# Patient Record
Sex: Male | Born: 1975 | Race: Black or African American | Hispanic: No | Marital: Single | Smoking: Never smoker
Health system: Southern US, Community
[De-identification: ages and names within clinical notes are randomized; demographics above are authoritative.]

## PROBLEM LIST (undated history)

## (undated) DIAGNOSIS — R0683 Snoring: Secondary | ICD-10-CM

## (undated) DIAGNOSIS — K259 Gastric ulcer, unspecified as acute or chronic, without hemorrhage or perforation: Secondary | ICD-10-CM

## (undated) DIAGNOSIS — E119 Type 2 diabetes mellitus without complications: Secondary | ICD-10-CM

## (undated) DIAGNOSIS — E785 Hyperlipidemia, unspecified: Secondary | ICD-10-CM

## (undated) DIAGNOSIS — F329 Major depressive disorder, single episode, unspecified: Secondary | ICD-10-CM

## (undated) DIAGNOSIS — F32A Depression, unspecified: Secondary | ICD-10-CM

## (undated) DIAGNOSIS — I10 Essential (primary) hypertension: Secondary | ICD-10-CM

## (undated) HISTORY — DX: Essential (primary) hypertension: I10

## (undated) HISTORY — DX: Snoring: R06.83

## (undated) HISTORY — DX: Hyperlipidemia, unspecified: E78.5

## (undated) HISTORY — DX: Morbid (severe) obesity due to excess calories: E66.01

## (undated) HISTORY — DX: Gastric ulcer, unspecified as acute or chronic, without hemorrhage or perforation: K25.9

## (undated) HISTORY — DX: Depression, unspecified: F32.A

## (undated) HISTORY — PX: ORCHIOPEXY: SHX479

## (undated) HISTORY — DX: Major depressive disorder, single episode, unspecified: F32.9

## (undated) HISTORY — DX: Type 2 diabetes mellitus without complications: E11.9

---

## 2006-06-01 ENCOUNTER — Emergency Department (HOSPITAL_COMMUNITY): Admission: EM | Admit: 2006-06-01 | Discharge: 2006-06-01 | Payer: Self-pay | Admitting: Emergency Medicine

## 2007-02-11 ENCOUNTER — Emergency Department (HOSPITAL_COMMUNITY): Admission: EM | Admit: 2007-02-11 | Discharge: 2007-02-12 | Payer: Self-pay | Admitting: Emergency Medicine

## 2007-02-12 ENCOUNTER — Encounter: Admission: RE | Admit: 2007-02-12 | Discharge: 2007-02-12 | Payer: Self-pay | Admitting: Internal Medicine

## 2009-03-03 ENCOUNTER — Emergency Department (HOSPITAL_COMMUNITY): Admission: EM | Admit: 2009-03-03 | Discharge: 2009-03-04 | Payer: Self-pay | Admitting: Emergency Medicine

## 2009-03-05 ENCOUNTER — Emergency Department (HOSPITAL_COMMUNITY): Admission: EM | Admit: 2009-03-05 | Discharge: 2009-03-06 | Payer: Self-pay | Admitting: Emergency Medicine

## 2009-03-27 ENCOUNTER — Ambulatory Visit: Payer: Self-pay | Admitting: Professional

## 2009-04-03 ENCOUNTER — Ambulatory Visit: Payer: Self-pay | Admitting: Professional

## 2009-04-10 ENCOUNTER — Ambulatory Visit: Payer: Self-pay | Admitting: Professional

## 2009-04-24 ENCOUNTER — Ambulatory Visit: Payer: Self-pay | Admitting: Professional

## 2009-05-01 ENCOUNTER — Ambulatory Visit: Payer: Self-pay | Admitting: Professional

## 2009-05-08 ENCOUNTER — Ambulatory Visit: Payer: Self-pay | Admitting: Professional

## 2009-05-15 ENCOUNTER — Ambulatory Visit: Payer: Self-pay | Admitting: Professional

## 2009-05-22 ENCOUNTER — Ambulatory Visit: Payer: Self-pay | Admitting: Professional

## 2009-06-05 ENCOUNTER — Ambulatory Visit: Payer: Self-pay | Admitting: Professional

## 2009-06-26 ENCOUNTER — Ambulatory Visit: Payer: Self-pay | Admitting: Professional

## 2009-07-10 ENCOUNTER — Ambulatory Visit: Payer: Self-pay | Admitting: Professional

## 2009-07-24 ENCOUNTER — Ambulatory Visit: Payer: Self-pay | Admitting: Professional

## 2009-08-21 ENCOUNTER — Ambulatory Visit: Payer: Self-pay | Admitting: Professional

## 2009-09-28 ENCOUNTER — Ambulatory Visit: Payer: Self-pay | Admitting: Professional

## 2010-04-23 LAB — DIFFERENTIAL
Basophils Absolute: 0 10*3/uL (ref 0.0–0.1)
Basophils Relative: 0 % (ref 0–1)
Eosinophils Relative: 0 % (ref 0–5)
Lymphocytes Relative: 32 % (ref 12–46)
Monocytes Absolute: 0.7 10*3/uL (ref 0.1–1.0)

## 2010-04-23 LAB — GLUCOSE, CAPILLARY: Glucose-Capillary: 117 mg/dL — ABNORMAL HIGH (ref 70–99)

## 2010-04-23 LAB — CBC
HCT: 42 % (ref 39.0–52.0)
MCHC: 33.9 g/dL (ref 30.0–36.0)
Platelets: 183 10*3/uL (ref 150–400)
RDW: 12.7 % (ref 11.5–15.5)

## 2010-04-23 LAB — COMPREHENSIVE METABOLIC PANEL
ALT: 16 U/L (ref 0–53)
AST: 19 U/L (ref 0–37)
Alkaline Phosphatase: 76 U/L (ref 39–117)
CO2: 22 mEq/L (ref 19–32)
Calcium: 9.3 mg/dL (ref 8.4–10.5)
GFR calc Af Amer: >60 mL/min (ref >60)
GFR calc non Af Amer: >60 mL/min (ref >60)
Glucose, Bld: 117 mg/dL — ABNORMAL HIGH (ref 70–99)
Potassium: 3.3 mEq/L — ABNORMAL LOW (ref 3.5–5.1)
Sodium: 137 mEq/L (ref 135–145)

## 2010-04-23 LAB — URINALYSIS, ROUTINE W REFLEX MICROSCOPIC
Glucose, UA: NEGATIVE mg/dL
Hgb urine dipstick: NEGATIVE
Protein, ur: NEGATIVE mg/dL

## 2010-04-23 LAB — RAPID URINE DRUG SCREEN, HOSP PERFORMED
Cocaine: NOT DETECTED
Opiates: NOT DETECTED

## 2010-10-25 LAB — COMPREHENSIVE METABOLIC PANEL
ALT: 26
AST: 19
Alkaline Phosphatase: 73
CO2: 25
Chloride: 99
GFR calc Af Amer: >60
GFR calc non Af Amer: >60
Sodium: 132 — ABNORMAL LOW
Total Bilirubin: 0.7

## 2010-10-25 LAB — CBC
HCT: 42.2
Hemoglobin: 14.1
MCHC: 33.5
Platelets: 158
RDW: 13.1

## 2010-10-25 LAB — DIFFERENTIAL
Basophils Absolute: 0
Basophils Relative: 1
Eosinophils Absolute: 0.1
Monocytes Absolute: 0.6
Neutro Abs: 3.9

## 2010-10-25 LAB — D-DIMER, QUANTITATIVE: D-Dimer, Quant: 0.34

## 2012-07-17 ENCOUNTER — Other Ambulatory Visit (HOSPITAL_COMMUNITY): Payer: Self-pay | Admitting: Internal Medicine

## 2012-07-17 ENCOUNTER — Ambulatory Visit (HOSPITAL_COMMUNITY)
Admission: RE | Admit: 2012-07-17 | Discharge: 2012-07-17 | Disposition: A | Discharge status: Home / Self Care | Payer: BC Managed Care – PPO | Source: Ambulatory Visit | Attending: Cardiovascular Disease | Admitting: Cardiovascular Disease

## 2012-07-17 DIAGNOSIS — R9431 Abnormal electrocardiogram [ECG] [EKG]: Secondary | ICD-10-CM

## 2012-07-17 DIAGNOSIS — E785 Hyperlipidemia, unspecified: Secondary | ICD-10-CM | POA: Insufficient documentation

## 2012-07-17 DIAGNOSIS — R079 Chest pain, unspecified: Secondary | ICD-10-CM

## 2012-07-17 DIAGNOSIS — I10 Essential (primary) hypertension: Secondary | ICD-10-CM

## 2012-07-17 NOTE — Progress Notes (Signed)
[facility]   2D echo completed 07/17/2012.   Cindy Idil Maslanka, RDCS  

## 2013-04-15 ENCOUNTER — Encounter: Payer: Self-pay | Admitting: Neurology

## 2013-05-07 ENCOUNTER — Encounter: Payer: Self-pay | Admitting: Neurology

## 2013-05-07 ENCOUNTER — Encounter (INDEPENDENT_AMBULATORY_CARE_PROVIDER_SITE_OTHER): Payer: Self-pay

## 2013-05-07 ENCOUNTER — Ambulatory Visit (INDEPENDENT_AMBULATORY_CARE_PROVIDER_SITE_OTHER): Payer: BC Managed Care – PPO | Admitting: Neurology

## 2013-05-07 VITALS — BP 132/89 | HR 97 | Resp 16 | Ht 72.0 in | Wt 289.0 lb

## 2013-05-07 DIAGNOSIS — R51 Headache: Secondary | ICD-10-CM

## 2013-05-07 DIAGNOSIS — R0989 Other specified symptoms and signs involving the circulatory and respiratory systems: Secondary | ICD-10-CM

## 2013-05-07 DIAGNOSIS — G478 Other sleep disorders: Secondary | ICD-10-CM

## 2013-05-07 DIAGNOSIS — R519 Headache, unspecified: Secondary | ICD-10-CM

## 2013-05-07 DIAGNOSIS — R0609 Other forms of dyspnea: Secondary | ICD-10-CM

## 2013-05-07 DIAGNOSIS — R0683 Snoring: Secondary | ICD-10-CM

## 2013-05-07 HISTORY — DX: Snoring: R06.83

## 2013-05-07 HISTORY — DX: Morbid (severe) obesity due to excess calories: E66.01

## 2013-05-07 NOTE — Progress Notes (Signed)
[facility]  Provider:  Melvyn Novas, M D  Referring Provider: Hoyle Sauer, MD Primary Care Physician:  Hoyle Sauer, MD  Chief Complaint  Patient presents with  . New Evaluation    Room 10  . Sleep Apnea    HPI:  Clinton Jackson is a 38 y.o. african -american, right handed  male  Is seen here as a referral  from Dr. Felipa Eth for a sleep evaluation.    As the [place] reports that he has been heavier than the average person for much of his adult life, and that there was normal age her weight gain over the last 2 years perhaps 10 pounds up and down. As a young man he developed depression and has been in treatment. He continues to see a Veterinary surgeon. He states that he is now in his first year at [employer] . When visiting his parents, his father stated that he knew his son was back in town- because  "the [place] were Shaking". He refers to the  patient's thunderously loud snoring.  Apparently this has been a factor for a decade or longer. The patient states that he has trouble falling asleep, staying asleep and wakes up un-refreshed. He always wished to sleep "another hour or two" Mr.Gettis goes to bed about midnight , needs an hour to fall asleep, he watches TV and feels this helps him to distract himself from daytime stress and worries. He wakes up 2 hors into his sleep time and than  goes to the bathroom , a total of 2 times at night. He may again 30 minutes to fall asleep again, and he watches TV. He rises at 9 in AM mostly spontaneously, has an alarm " to make sure" . He reports vivid dreams,"crazy nightmares ". He will wake up out of dream sleep from snoring, gasping for air.  He has a breakfast and goes to classes online , full time. He is not on campus.  He doesn't exercise . He has trouble to self pace through his day. He lives and sleeps alone.  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        He has had neither neck injuries nor  surgery, nor jaw or ENT surgery.  He is single, denies any use of tobacco or alcohol, but  drinks tea and Sodas.  His parents are both CPAP users, with OSA.  He has 4 sisters and 2 brothers, many of them snore but none has been diagnosed   The patient underwent a sleep study at [facility] in 2011, at the time with an Epworth sleepiness score of 9 points, Beck Depression Inventory and Dorst at 9 points, and on Klonopin, but will be on, fluoxetine and amlodipine. The patient had an AHI of 3.3 and nor periodic limb movements. His heart rate was in normal sinus rhythm. The study found no significant sleep apnea or oxygen desaturations. Snoring was noted.   Review of Systems: Out of a complete 14 system review, the patient complains of only the following symptoms, and all other reviewed systems are negative.  Today's Epworth sleepiness score of is 5 points, his fatigue severity score is 50 points and he feels his depression is well controlled. He states that he feels he at this time sleepy even when he gets enough sleep apparently about 8 hours of sleep nightly on average he reports also some headaches  and recent weight gain.   History   Social History  . Marital Status: Single    Spouse Name: N/A    Number of Children: N/A  . Years of Education: Masters-2   Occupational History  . Teacher     [employer]  .     Social History Main Topics  . Smoking status: Never Smoker   . Smokeless tobacco: Never Used  . Alcohol Use: No  . Drug Use: No  . Sexual Activity: Not on file   Other Topics Concern  . Not on file   Social History Narrative   Patient is single.   Patient drinks three cups of tea daily and one soda daily.   Patient is a Physicist, medical.   Patient has a Scientist, water quality (2).   Patient is right-handed.    Family History  Problem Relation Age of Onset  . Hypertension Father   . Coronary artery disease Father   . Ulcers  Father   . Hyperlipidemia Father   . Sleep apnea Father   . Diabetes Mellitus II Mother   . Arthritis/Rheumatoid Mother   . Hypertension Mother   . Sleep apnea Mother     Past Medical History  Diagnosis Date  . Hypertension   . Multiple gastric ulcers     Past Surgical History  Procedure Laterality Date  . Orchiopexy      Current Outpatient Prescriptions  Medication Sig Dispense Refill  . amLODipine (NORVASC) 10 MG tablet 1 tablet daily.      Marland Kitchen aspirin 81 MG tablet Take 81 mg by mouth daily.      Marland Kitchen buPROPion (WELLBUTRIN SR) 200 MG 12 hr tablet 1 tablet daily.      Marland Kitchen FLUoxetine (PROZAC) 10 MG capsule Take 10 mg by mouth daily.      Marland Kitchen FLUoxetine (PROZAC) 40 MG capsule Take 40 mg by mouth daily.      . fluticasone (FLONASE) 50 MCG/ACT nasal spray 2 sprays daily.      . hydrochlorothiazide (HYDRODIURIL) 25 MG tablet 1 tablet daily.       No current facility-administered medications for this visit.    Allergies as of 05/07/2013 - Review Complete 05/07/2013  Allergen Reaction Noted  . Penicillins Rash 05/07/2013    Vitals: BP 132/89  Pulse 97  Resp 16  Ht 6' (1.829 m)  Wt 289 lb (131.09 kg)  BMI 39.19 kg/m2 Last Weight:  Wt Readings from Last 1 Encounters:  05/07/13 289 lb (131.09 kg)       Physical exam:  General: The patient is awake, alert and appears not in acute distress. The patient is well groomed. Head: Normocephalic, atraumatic. Neck is supple. Mallampati  2 with an arched, peaked palate and lateral narrowing, , neck circumference:20  Cardiovascular:  Regular rate and rhythm- 100 bpm ,  without  murmurs or carotid bruit, and without distended neck veins. Respiratory: Lungs are clear to auscultation. Skin:  Without evidence of edema, or rash Trunk: BMI is elevated and patient  has normal posture.  Neurologic exam : The patient is awake and alert, oriented to place and time.  Memory subjective  described as intact. There is a normal attention span &  concentration ability. Speech is fluent without dysarthria, dysphonia or aphasia. Mood and affect are appropriate.  Cranial nerves: Pupils are equal and briskly reactive to light. Funduscopic exam without  evidence of pallor or edema. Extraocular movements  in vertical and horizontal planes intact and without nystagmus. Visual  fields by finger perimetry are intact. Hearing to finger rub intact.  Facial sensation intact to fine touch. Facial motor strength is symmetric and tongue and uvula move midline.  Motor exam:  Normal tone , muscle bulk and symmetric normal strength in all extremities.  Sensory:  Fine touch, pinprick and vibration were tested in all extremities. Proprioception is tested  normal.  Coordination: Rapid alternating movements in the fingers/hands is tested and normal. Finger-to-nose maneuver tested and normal without evidence of ataxia, dysmetria or tremor.  Gait and station: Patient walks without assistive device . Deep tendon reflexes: in the  upper and lower extremities are symmetric and intact. Babinski maneuver downgoing.   Assessment:  After physical and neurologic examination, review of laboratory studies, imaging, neurophysiology testing and pre-existing records, assessment is  Thunderous snoring and habitual mouth breathing , secondary to obesity , narrow Mallampati and  nasal congestion, OSA  Is likely.  Insomnia, nocturia  may be due to depression and OSA.  morning headaches, CO2 retainer? Obesity hypoventilation is a differential possibility.   Last study revealed  UARS - may be a candidate for a dental device, he has bruxism.   Plan:  Treatment plan and additional workup : SPLIT at 15 and 3% score, CO2 needed.  Get patient as later sleep patient , and allow benadryl po.  Discussed [place] for insomnia .

## 2013-05-07 NOTE — Patient Instructions (Signed)
Insomnia Insomnia is frequent trouble falling and/or staying asleep. Insomnia can be a long term problem or a short term problem. Both are common. Insomnia can be a short term problem when the wakefulness is related to a certain stress or worry. Long term insomnia is often related to ongoing stress during waking hours and/or poor sleeping habits. Overtime, sleep deprivation itself can make the problem worse. Every little thing feels more severe because you are overtired and your ability to cope is decreased. CAUSES   Stress, anxiety, and depression.  Poor sleeping habits.  Distractions such as TV in the bedroom.  Naps close to bedtime.  Engaging in emotionally charged conversations before bed.  Technical reading before sleep.  Alcohol and other sedatives. They may make the problem worse. They can hurt normal sleep patterns and normal dream activity.  Stimulants such as caffeine for several hours prior to bedtime.  Pain syndromes and shortness of breath can cause insomnia.  Exercise late at night.  Changing time zones may cause sleeping problems (jet lag). It is sometimes helpful to have someone observe your sleeping patterns. They should look for periods of not breathing during the night (sleep apnea). They should also look to see how long those periods last. If you live alone or observers are uncertain, you can also be observed at a sleep clinic where your sleep patterns will be professionally monitored. Sleep apnea requires a checkup and treatment. Give your caregivers your medical history. Give your caregivers observations your family has made about your sleep.  SYMPTOMS   Not feeling rested in the morning.  Anxiety and restlessness at bedtime.  Difficulty falling and staying asleep. TREATMENT   Your caregiver may prescribe treatment for an underlying medical disorders. Your caregiver can give advice or help if you are using alcohol or other drugs for self-medication. Treatment  of underlying problems will usually eliminate insomnia problems.  Medications can be prescribed for short time use. They are generally not recommended for lengthy use.  Over-the-counter sleep medicines are not recommended for lengthy use. They can be habit forming.  You can promote easier sleeping by making lifestyle changes such as:  Using relaxation techniques that help with breathing and reduce muscle tension.  Exercising earlier in the day.  Changing your diet and the time of your last meal. No night time snacks.  Establish a regular time to go to bed.  Counseling can help with stressful problems and worry.  Soothing music and white noise may be helpful if there are background noises you cannot remove.  Stop tedious detailed work at least one hour before bedtime. HOME CARE INSTRUCTIONS   Keep a diary. Inform your caregiver about your progress. This includes any medication side effects. See your caregiver regularly. Take note of:  Times when you are asleep.  Times when you are awake during the night.  The quality of your sleep.  How you feel the next day. This information will help your caregiver care for you.  Get out of bed if you are still awake after 15 minutes. Read or do some quiet activity. Keep the lights down. Wait until you feel sleepy and go back to bed.  Keep regular sleeping and waking hours. Avoid naps.  Exercise regularly.  Avoid distractions at bedtime. Distractions include watching television or engaging in any intense or detailed activity like attempting to balance the household checkbook.  Develop a bedtime ritual. Keep a familiar routine of bathing, brushing your teeth, climbing into bed at the same   time each night, listening to soothing music. Routines increase the success of falling to sleep faster.  Use relaxation techniques. This can be using breathing and muscle tension release routines. It can also include visualizing peaceful scenes. You can  also help control troubling or intruding thoughts by keeping your mind occupied with boring or repetitive thoughts like the old concept of counting sheep. You can make it more creative like imagining planting one beautiful flower after another in your backyard garden.  During your day, work to eliminate stress. When this is not possible use some of the previous suggestions to help reduce the anxiety that accompanies stressful situations. MAKE SURE YOU:   Understand these instructions.  Will watch your condition.  Will get help right away if you are not doing well or get worse. Document Released: 01/19/2000 Document Revised: 04/15/2011 Document Reviewed: 02/18/2007 ExitCare Patient Information 2014 [employer]. Sleep Apnea  Sleep apnea is a sleep disorder characterized by abnormal pauses in breathing while you sleep. When your breathing pauses, the level of oxygen in your blood decreases. This causes you to move out of deep sleep and into light sleep. As a result, your quality of sleep is poor, and the system that carries your blood throughout your body (cardiovascular system) experiences stress. If sleep apnea remains untreated, the following conditions can develop:  High blood pressure (hypertension).  Coronary artery disease.  Inability to achieve or maintain an erection (impotence).  Impairment of your thought process (cognitive dysfunction). There are three types of sleep apnea: 1. Obstructive sleep apnea Pauses in breathing during sleep because of a blocked airway. 2. Central sleep apnea Pauses in breathing during sleep because the area of the brain that controls your breathing does not send the correct signals to the muscles that control breathing. 3. Mixed sleep apnea A combination of both obstructive and central sleep apnea. RISK FACTORS The following risk factors can increase your risk of developing sleep apnea:  Being overweight.  Smoking.  Having narrow passages in  your nose and throat.  Being of older age.  Being male.  Alcohol use.  Sedative and tranquilizer use.  Ethnicity. Among individuals younger than 35 years, African Americans are at increased risk of sleep apnea. SYMPTOMS   Difficulty staying asleep.  Daytime sleepiness and fatigue.  Loss of energy.  Irritability.  Loud, heavy snoring.  Morning headaches.  Trouble concentrating.  Forgetfulness.  Decreased interest in sex. DIAGNOSIS  In order to diagnose sleep apnea, your caregiver will perform a physical examination. Your caregiver may suggest that you take a home sleep test. Your caregiver may also recommend that you spend the night in a sleep lab. In the sleep lab, several monitors record information about your heart, lungs, and brain while you sleep. Your leg and arm movements and blood oxygen level are also recorded. TREATMENT The following actions may help to resolve mild sleep apnea:  Sleeping on your side.   Using a decongestant if you have nasal congestion.   Avoiding the use of depressants, including alcohol, sedatives, and narcotics.   Losing weight and modifying your diet if you are overweight. There also are devices and treatments to help open your airway:  Oral appliances. These are custom-made mouthpieces that shift your lower jaw forward and slightly open your bite. This opens your airway.  Devices that create positive airway pressure. This positive pressure "splints" your airway open to help you breathe better during sleep. The following devices create positive airway pressure:  Continuous positive airway   pressure (CPAP) device. The CPAP device creates a continuous level of air pressure with an air pump. The air is delivered to your airway through a mask while you sleep. This continuous pressure keeps your airway open.  Nasal expiratory positive airway pressure (EPAP) device. The EPAP device creates positive air pressure as you exhale. The device  consists of single-use valves, which are inserted into each nostril and held in place by adhesive. The valves create very little resistance when you inhale but create much more resistance when you exhale. That increased resistance creates the positive airway pressure. This positive pressure while you exhale keeps your airway open, making it easier to breath when you inhale again.  Bilevel positive airway pressure (BPAP) device. The BPAP device is used mainly in patients with central sleep apnea. This device is similar to the CPAP device because it also uses an air pump to deliver continuous air pressure through a mask. However, with the BPAP machine, the pressure is set at two different levels. The pressure when you exhale is lower than the pressure when you inhale.  Surgery. Typically, surgery is only done if you cannot comply with less invasive treatments or if the less invasive treatments do not improve your condition. Surgery involves removing excess tissue in your airway to create a wider passage way. Document Released: 01/11/2002 Document Revised: 05/18/2012 Document Reviewed: 05/30/2011 ExitCare Patient Information 2014 [employer].  

## 2013-06-09 ENCOUNTER — Ambulatory Visit (INDEPENDENT_AMBULATORY_CARE_PROVIDER_SITE_OTHER): Payer: BC Managed Care – PPO | Admitting: Neurology

## 2013-06-09 DIAGNOSIS — G4733 Obstructive sleep apnea (adult) (pediatric): Secondary | ICD-10-CM

## 2013-06-09 DIAGNOSIS — R0683 Snoring: Secondary | ICD-10-CM

## 2013-06-09 DIAGNOSIS — G478 Other sleep disorders: Secondary | ICD-10-CM

## 2013-06-09 DIAGNOSIS — R51 Headache: Secondary | ICD-10-CM

## 2013-06-09 DIAGNOSIS — R519 Headache, unspecified: Secondary | ICD-10-CM

## 2013-06-30 ENCOUNTER — Telehealth: Payer: Self-pay | Admitting: Neurology

## 2013-06-30 ENCOUNTER — Encounter: Payer: Self-pay | Admitting: *Deleted

## 2013-06-30 DIAGNOSIS — G4733 Obstructive sleep apnea (adult) (pediatric): Secondary | ICD-10-CM

## 2013-06-30 NOTE — Telephone Encounter (Signed)
I called and spoke with the patient about his recent sleep study results. I informed the patient that the study revealed obstructive sleep apnea and insomnia and that Dr. Vickey Huger recommend treatment in the form of CPAP. I will fax the CPAP order to [facility] who will contact the patient. I will fax a copy of the report to Dr. Vicente Males office along with a follow up instruction letter.

## 2014-10-05 DIAGNOSIS — K7581 Nonalcoholic steatohepatitis (NASH): Secondary | ICD-10-CM | POA: Insufficient documentation

## 2014-11-02 ENCOUNTER — Ambulatory Visit: Payer: Self-pay | Admitting: Dietician

## 2014-12-05 ENCOUNTER — Encounter: Payer: BC Managed Care – PPO | Attending: Internal Medicine | Admitting: Dietician

## 2014-12-05 ENCOUNTER — Encounter: Payer: Self-pay | Admitting: Dietician

## 2014-12-05 DIAGNOSIS — Z6838 Body mass index (BMI) 38.0-38.9, adult: Secondary | ICD-10-CM | POA: Diagnosis not present

## 2014-12-05 DIAGNOSIS — E119 Type 2 diabetes mellitus without complications: Secondary | ICD-10-CM

## 2014-12-05 DIAGNOSIS — Z713 Dietary counseling and surveillance: Secondary | ICD-10-CM | POA: Insufficient documentation

## 2014-12-05 NOTE — Progress Notes (Signed)
Diabetes Self-Management Education  Visit Type: First/Initial  Appt. Start Time: 1445 Appt. End Time: 1615  12/05/2014  Mr. Clinton Jackson, identified by name and date of birth, is a 39 y.o. male with a diagnosis of Diabetes: Type 2.   Patient is here alone.  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  Hx includes HTN, elevated liver enzymes, GERD, morbid obesity, depression (controlled on current meds), obstructive sleep apnea (since 2009 but started using C-pap this summer- does not like it as nightmares increase).  Patient would like to get off of HTN meds.  Discussed benefits of low sodium diet, weight loss, and exercise.  Discussed not eating for 2-3 hours prior to bed secondary to GERD.  Patient states that evening snack is often just a habit and is not hungry.  Patient also discussed his worries about developing diabetes.  His mother has Type 2 DM.  Patient was hesitant to accept this diagnosis.  HgbA1C was not available but fasting blood sugar was 210.  Weight 276 lbs today decreased from 292 lbs 1 month ago (16 lb loss).  Highest weight 295 lbs and lowest 225 lbs 15 years ago.  He is motivated to continue to lose weight.  He lives alone and eats out most dinners.  He started to work out at [employer] for a couple of months but has not kept this habit since school has resumed.  ASSESSMENT  Height 5\' 11"  (1.803 m), weight 276 lb (125.193 kg). Body mass index is 38.51 kg/(m^2).      Diabetes Self-Management Education - 12/05/14 1508    Visit Information   Visit Type First/Initial   Initial Visit   Diabetes Type Type 2   Are you currently following a meal plan? No   Are you taking your medications as prescribed? Not on Medications   Health Coping   How would you rate your overall health? Fair   Psychosocial Assessment   Patient Belief/Attitude about Diabetes Motivated to manage diabetes   Self-care barriers None   Self-management support  Doctor's office   Other persons present Patient   Patient Concerns Weight Control;Glycemic Control;Nutrition/Meal planning   Special Needs None   Preferred Learning Style No preference indicated   Learning Readiness Ready   How often do you need to have someone help you when you read instructions, pamphlets, or other written materials from your doctor or pharmacy? 1 - Never   What is the last grade level you completed in school? PhD candidate   Complications   How often do you check your blood sugar? 1-2 times/day  once every other day   Fasting Blood glucose range (mg/dL) [phone]  1 month ago   Postprandial Blood glucose range (mg/dL) [phone]  current   Number of hypoglycemic episodes per month 0   Number of hyperglycemic episodes per week 0   Have you had a dilated eye exam in the past 12 months? Yes   Have you had a dental exam in the past 12 months? Yes   Are you checking your feet? No   Dietary Intake   Breakfast fruit and metamucil with water OR 2 packs variety instant oatmeal with blueberries OR Raisin Bran with Almond Milk OR pancakes with syrup, Malawiturkey baon/sausage, eggs OR smoothie (spinach, water, strawberries/peaches almond milk, plain yogurt)   Snack (morning) peanut butter chocolate bar OR crackers and cheese (NABS) OR peanuts   Lunch Mal[place]urkey, pepper jack cheese, and spinach wrap with olive  oil and honey mustard   Snack (afternoon) none   Dinner subway (col d cut combo), Chick fil[employer]A (salad)  J&S (meatloaf, mashed potatoes, mac and cheese, turnip greens, apple pie)   Snack (evening) Peanut butter, chocolate bar OR crackers with cheese (NABS) OR peanuts   Beverage(s) water, diet green tea, grapefruit juice   Exercise   Exercise Type ADL's   How many days per week to you exercise? 0   How many minutes per day do you exercise? 0   Total minutes per week of exercise 0   Patient Education   Previous Diabetes Education No   Disease state  Definition of diabetes, type 1 and 2,  and the diagnosis of diabetes;Factors that contribute to the development of diabetes;Explored patient's options for treatment of their diabetes   Nutrition management  Role of diet in the treatment of diabetes and the relationship between the three main macronutrients and blood glucose level;Food label reading, portion sizes and measuring food.;Meal options for control of blood glucose level and chronic complications.   Physical activity and exercise  Role of exercise on diabetes management, blood pressure control and cardiac health.   Monitoring Identified appropriate SMBG and/or A1C goals.   Chronic complications Relationship between chronic complications and blood glucose control;Assessed and discussed foot care and prevention of foot problems;Retinopathy and reason for yearly dilated eye exams;Dental care   Psychosocial adjustment Role of stress on diabetes;Identified and addressed patients feelings and concerns about diabetes   Individualized Goals (developed by patient)   Nutrition General guidelines for healthy choices and portions discussed;Follow meal plan discussed   Physical Activity Exercise 5-7 days per week;30 minutes per day   Medications Not Applicable   Monitoring  test my blood glucose as discussed   Outcomes   Expected Outcomes Demonstrated interest in learning. Expect positive outcomes   Future DMSE 4-6 wks   Program Status Completed      Individualized Plan for Diabetes Self-Management Training:   Learning Objective:  Patient will have a greater understanding of diabetes self-management. Patient education plan is to attend individual and/or group sessions per assessed needs and concerns.   Plan:   Patient Instructions  Increase non starchy vegetables. Keep carbohydrates consistent at each meal.   Small amounts protein with each meal and snack.  Aim for 4 Carb Choices per meal (60 grams) +/- 1 either way  Aim for 0-1 Carbs per snack if hungry  Include protein in  moderation with your meals and snacks Consider reading food labels for Total Carbohydrate and Fat Grams of foods Consider  increasing your activity level by walking for 30 minutes daily as tolerated Consider checking BG at alternate times per day as directed by MD  Consider lower sodium.  Great job on the changes that you have made!    Expected Outcomes:  Demonstrated interest in learning. Expect positive outcomes  Education material provided: Living Well with Diabetes, Food label handouts, A1C conversion sheet, Meal plan card, My Plate and Snack sheet  If problems or questions, patient to contact team via:  Phone and Email  Future DSME appointment: 4-6 wks

## 2014-12-05 NOTE — Patient Instructions (Signed)
Increase non starchy vegetables. Keep carbohydrates consistent at each meal.   Small amounts protein with each meal and snack.  Aim for 4 Carb Choices per meal (60 grams) +/- 1 either way  Aim for 0-1 Carbs per snack if hungry  Include protein in moderation with your meals and snacks Consider reading food labels for Total Carbohydrate and Fat Grams of foods Consider  increasing your activity level by walking for 30 minutes daily as tolerated Consider checking BG at alternate times per day as directed by MD  Consider lower sodium.  Great job on the changes that you have made!

## 2014-12-28 DIAGNOSIS — I10 Essential (primary) hypertension: Secondary | ICD-10-CM | POA: Insufficient documentation

## 2015-02-20 ENCOUNTER — Encounter: Payer: BC Managed Care – PPO | Attending: Internal Medicine | Admitting: Dietician

## 2015-02-20 ENCOUNTER — Encounter: Payer: Self-pay | Admitting: Dietician

## 2015-02-20 DIAGNOSIS — Z6839 Body mass index (BMI) 39.0-39.9, adult: Secondary | ICD-10-CM | POA: Diagnosis not present

## 2015-02-20 DIAGNOSIS — Z713 Dietary counseling and surveillance: Secondary | ICD-10-CM | POA: Insufficient documentation

## 2015-02-20 DIAGNOSIS — E119 Type 2 diabetes mellitus without complications: Secondary | ICD-10-CM | POA: Insufficient documentation

## 2015-02-20 NOTE — Patient Instructions (Signed)
Don't shop when you are hungry. Make lifestyle decisions to feel better.  Consider Omega 3 (salmon, tuna, ground flax seeds, chia seeds, walnuts) Eat slowly.  A meal should take 20-30 minutes. Choose a small amount of protein each time you have a carbohydrate (greek yogurt, 2 T peanut butter, 1 ounce nuts, 1 ounce cheese, 1 ounce meat, low fat milk) Eat mindfully.  Am I hungry or eating for another reason? Don't wait to eat until you are ravenous.  (see hunger/fullness scale) Calorie King app on iphone to help make healthier choices when eating out.

## 2015-02-21 NOTE — Progress Notes (Signed)
Diabetes Self-Management Education  Visit Type: Follow-up  Appt. Start Time: 1115 Appt. End Time: 1145  02/21/2015  Mr. Clinton Jackson, identified by name and date of birth, is a 40 y.o. male with a diagnosis of Diabetes: Type 2 Diabetes.  Patient is here alone. 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 Hx includes HTN, elevated liver enzymes, GERD, morbid obesity, depression (controlled on current meds), obstructive sleep apnea (since 2009 but started using C-pap this summer- does not like it as nightmares increase). Patient would like to get off of HTN meds. Discussed benefits of low sodium diet, weight loss, and exercise. Discussed not eating for 2-3 hours prior to bed secondary to GERD. Patient states that evening snack is often just a habit and is not hungry. Patient also discussed his worries about developing diabetes. His mother has Type 2 DM. Patient was hesitant to accept this diagnosis. HgbA1C was not available but fasting blood sugar was 210.  Weight 276 lbs today decreased from 292 lbs 1 month ago (16 lb loss). Highest weight 295 lbs and lowest 225 lbs 15 years ago. He is motivated to continue to lose weight. He lives alone and eats out most dinners. He started to work out at [employer] for a couple of months but has not kept this habit since school has resumed. Today he states that he continues to pay for the gym but has not been going.  He got off schedule with the holidays and bad weather and has been eating what ever he wants most often.  He stated receiving a letter from his insurance company stating that he needed to use his c-pap 4 of 7 days.  He states that he has been able to use this without nightmares when he is sure to put water to hydrate the air.  He has not been checking his blood sugar for the past month.  Weight has increased to 280 lbs (02/19/14).     ASSESSMENT  Weight 280 lb (127.007 kg). Body mass index is 39.07  kg/(m^2).      Diabetes Self-Management Education - 02/20/15 1129    Visit Information   Visit Type Follow-up   Health Coping   How would you rate your overall health? Good   Psychosocial Assessment   Patient Belief/Attitude about Diabetes Other (comment)  unmotivated to make better health choices   Self-care barriers None   Self-management support Doctor's office   Other persons present Patient   Patient Concerns Nutrition/Meal planning;Weight Control;Support   Special Needs None   Preferred Learning Style No preference indicated   Learning Readiness Ready   Complications   Last HgB A1C per patient/outside source --  decreased since last visit per patient but unknown number   Exercise   Exercise Type ADL's   Patient Education   Nutrition management  Role of diet in the treatment of diabetes and the relationship between the three main macronutrients and blood glucose level;Food label reading, portion sizes and measuring food.;Meal options for control of blood glucose level and chronic complications.   Physical activity and exercise  Role of exercise on diabetes management, blood pressure control and cardiac health.   Psychosocial adjustment Worked with patient to identify barriers to care and solutions;Role of stress on diabetes   Personal strategies to promote health Lifestyle issues that need to be addressed for better diabetes care   Individualized Goals (developed by patient)   Nutrition General guidelines for healthy choices and portions discussed  Physical Activity Exercise 5-7 days per week;30 minutes per day   Patient Self-Evaluation of Goals - Patient rates self as meeting previously set goals (% of time)   Nutrition < 25%   Physical Activity < 25%   Medications >75%   Problem Solving < 25%   Reducing Risk < 25%   Health Coping < 25%   Outcomes   Expected Outcomes Demonstrated interest in learning. Expect positive outcomes   Future DMSE 2 months   Program Status  Completed   Subsequent Visit   Since your last visit have you continued or begun to take your medications as prescribed? Yes   Since your last visit have you had your blood pressure checked? Yes   Is your most recent blood pressure lower, unchanged, or higher since your last visit? Unchanged   Since your last visit have you experienced any weight changes? Gain   Weight Gain (lbs) 4      Individualized Plan for Diabetes Self-Management Training:   Learning Objective:  Patient will have a greater understanding of diabetes self-management. Patient education plan is to attend individual and/or group sessions per assessed needs and concerns. Discussed mindful eating and motivation for lifestyle change.   Plan:   Patient Instructions  Don't shop when you are hungry. Make lifestyle decisions to feel better.  Consider Omega 3 (salmon, tuna, ground flax seeds, chia seeds, walnuts) Eat slowly.  A meal should take 20-30 minutes. Choose a small amount of protein each time you have a carbohydrate (greek yogurt, 2 T peanut butter, 1 ounce nuts, 1 ounce cheese, 1 ounce meat, low fat milk) Eat mindfully.  Am I hungry or eating for another reason? Don't wait to eat until you are ravenous.  (see hunger/fullness scale) Calorie King app on iphone to help make healthier choices when eating out.   Expected Outcomes:  Demonstrated interest in learning. Expect positive outcomes  Education material provided:   If problems or questions, patient to contact team via:  Phone and Email  Future DSME appointment: 2 months

## 2015-04-24 ENCOUNTER — Ambulatory Visit: Payer: BC Managed Care – PPO | Admitting: Dietician

## 2017-10-16 ENCOUNTER — Other Ambulatory Visit (HOSPITAL_COMMUNITY): Payer: Self-pay | Admitting: Internal Medicine

## 2017-10-16 DIAGNOSIS — R112 Nausea with vomiting, unspecified: Secondary | ICD-10-CM

## 2017-10-16 DIAGNOSIS — R748 Abnormal levels of other serum enzymes: Secondary | ICD-10-CM

## 2017-10-17 ENCOUNTER — Ambulatory Visit (HOSPITAL_COMMUNITY)
Admission: RE | Admit: 2017-10-17 | Discharge: 2017-10-17 | Disposition: A | Discharge status: Home / Self Care | Payer: BC Managed Care – PPO | Source: Ambulatory Visit | Attending: Internal Medicine | Admitting: Internal Medicine

## 2017-10-17 DIAGNOSIS — K8020 Calculus of gallbladder without cholecystitis without obstruction: Secondary | ICD-10-CM | POA: Insufficient documentation

## 2017-10-17 DIAGNOSIS — R112 Nausea with vomiting, unspecified: Secondary | ICD-10-CM | POA: Insufficient documentation

## 2017-10-17 DIAGNOSIS — R749 Abnormal serum enzyme level, unspecified: Secondary | ICD-10-CM | POA: Diagnosis present

## 2017-10-17 DIAGNOSIS — R748 Abnormal levels of other serum enzymes: Secondary | ICD-10-CM

## 2017-11-06 ENCOUNTER — Other Ambulatory Visit: Payer: Self-pay | Admitting: Physician Assistant

## 2017-11-06 DIAGNOSIS — R1013 Epigastric pain: Secondary | ICD-10-CM

## 2017-11-06 DIAGNOSIS — R7989 Other specified abnormal findings of blood chemistry: Secondary | ICD-10-CM

## 2017-11-06 DIAGNOSIS — R112 Nausea with vomiting, unspecified: Secondary | ICD-10-CM

## 2017-11-06 DIAGNOSIS — K8020 Calculus of gallbladder without cholecystitis without obstruction: Secondary | ICD-10-CM

## 2017-11-06 DIAGNOSIS — R945 Abnormal results of liver function studies: Secondary | ICD-10-CM

## 2017-11-14 ENCOUNTER — Other Ambulatory Visit: Payer: Self-pay | Admitting: Physician Assistant

## 2017-11-14 DIAGNOSIS — R7989 Other specified abnormal findings of blood chemistry: Secondary | ICD-10-CM

## 2017-11-14 DIAGNOSIS — R1013 Epigastric pain: Secondary | ICD-10-CM

## 2017-11-14 DIAGNOSIS — R945 Abnormal results of liver function studies: Secondary | ICD-10-CM

## 2017-11-14 DIAGNOSIS — R112 Nausea with vomiting, unspecified: Secondary | ICD-10-CM

## 2017-11-14 DIAGNOSIS — K920 Hematemesis: Secondary | ICD-10-CM

## 2017-11-14 DIAGNOSIS — K8020 Calculus of gallbladder without cholecystitis without obstruction: Secondary | ICD-10-CM

## 2017-11-19 ENCOUNTER — Other Ambulatory Visit: Payer: BC Managed Care – PPO

## 2017-11-19 ENCOUNTER — Ambulatory Visit
Admission: RE | Admit: 2017-11-19 | Discharge: 2017-11-19 | Disposition: A | Discharge status: Home / Self Care | Payer: BC Managed Care – PPO | Source: Ambulatory Visit | Attending: Physician Assistant | Admitting: Physician Assistant

## 2017-11-19 DIAGNOSIS — K920 Hematemesis: Secondary | ICD-10-CM

## 2017-11-19 DIAGNOSIS — K8020 Calculus of gallbladder without cholecystitis without obstruction: Secondary | ICD-10-CM

## 2017-11-19 DIAGNOSIS — R112 Nausea with vomiting, unspecified: Secondary | ICD-10-CM

## 2017-11-19 DIAGNOSIS — R1013 Epigastric pain: Secondary | ICD-10-CM

## 2017-11-19 DIAGNOSIS — R7989 Other specified abnormal findings of blood chemistry: Secondary | ICD-10-CM

## 2017-11-19 DIAGNOSIS — R945 Abnormal results of liver function studies: Secondary | ICD-10-CM

## 2017-11-19 MED ORDER — IOPAMIDOL (ISOVUE-370) INJECTION 76%
125.0000 mL | Freq: Once | INTRAVENOUS | Status: AC | PRN
  Administered 2017-11-19: 125 mL via INTRAVENOUS

## 2018-05-27 ENCOUNTER — Telehealth: Payer: Self-pay | Admitting: Neurology

## 2018-05-27 NOTE — Telephone Encounter (Signed)
Due to current COVID 19 pandemic, our office is severely reducing in office visits, in order to minimize the risk to our patients and healthcare providers.  Pt understands that although there may be some limitations with this type of visit, we will take all precautions to reduce any security or privacy concerns.  Pt understands that this will be treated like an in office visit and we will file with pt's insurance, and there may be a patient responsible charge related to this service. Pt's email is [email or web address]. Pt understands that the [employer] webex software must be downloaded and operational on the device pt plans to use for the visit. Pt understands that the nurse will be calling to go over pt's chart.

## 2018-06-08 ENCOUNTER — Encounter: Payer: Self-pay | Admitting: Neurology

## 2018-06-08 NOTE — Addendum Note (Signed)
Addended by: Judi Cong on: 06/08/2018 12:10 PM   Modules accepted: Orders

## 2018-06-08 NOTE — Telephone Encounter (Signed)
Called the patient to review their chart and made sure that everything was up to date. Patient informed that he received the e-mail for the visit. Instructed to make sure they hold on to the e-mail for the upcoming appointment as it is necessary to access their appointment. Instructed the patient that apx 30 min prior to the appointment the front staff will contact them to make sure they are ready to go for their appointment in case there is any need for troubleshooting it can be completed prior to the appointment time. Pt verbalized understanding. Patient also instructed to completed the sleep scale and reply with answers to that and neck circumference measurements prior to the appointment time.   His neck measurement is 18.5 in.

## 2018-06-09 ENCOUNTER — Ambulatory Visit (INDEPENDENT_AMBULATORY_CARE_PROVIDER_SITE_OTHER): Payer: BC Managed Care – PPO | Admitting: Neurology

## 2018-06-09 ENCOUNTER — Other Ambulatory Visit: Payer: Self-pay

## 2018-06-09 ENCOUNTER — Encounter: Payer: Self-pay | Admitting: Neurology

## 2018-06-09 DIAGNOSIS — R0683 Snoring: Secondary | ICD-10-CM

## 2018-06-09 DIAGNOSIS — E119 Type 2 diabetes mellitus without complications: Secondary | ICD-10-CM

## 2018-06-09 DIAGNOSIS — G4733 Obstructive sleep apnea (adult) (pediatric): Secondary | ICD-10-CM | POA: Insufficient documentation

## 2018-06-09 DIAGNOSIS — F5104 Psychophysiologic insomnia: Secondary | ICD-10-CM

## 2018-06-09 NOTE — Patient Instructions (Signed)
Please remember to try to maintain good sleep hygiene, which means: Keep a regular sleep and wake schedule, try not to exercise or have a meal within 2 hours of your bedtime, try to keep your bedroom conducive for sleep, that is, cool and dark, without light distractors such as an illuminated alarm clock, and refrain from watching TV right before sleep or in the middle of the night and do not keep the TV or radio on during the night. Also, try not to use or play on electronic devices at bedtime, such as your cell phone, tablet PC or laptop. If you like to read at bedtime on an electronic device, try to dim the background light as much as possible. Do not eat in the middle of the night.   We will request a sleep study.    We will look for  snoring or sleep apnea.   For chronic insomnia, you are best followed by a psychiatrist and/or sleep psychologist should no organic sleep disorder be evident..   We will call you with the sleep study results and make a follow up appointment with NP as indicated.      Insomnia Insomnia is a sleep disorder that makes it difficult to fall asleep or stay asleep. Insomnia can cause fatigue, low energy, difficulty concentrating, mood swings, and poor performance at work or school. There are three different ways to classify insomnia:  Difficulty falling asleep.  Difficulty staying asleep.  Waking up too early in the morning. Any type of insomnia can be long-term (chronic) or short-term (acute). Both are common. Short-term insomnia usually lasts for three months or less. Chronic insomnia occurs at least three times a week for longer than three months. What are the causes? Insomnia may be caused by another condition, situation, or substance, such as:  Anxiety.  Certain medicines.  Gastroesophageal reflux disease (GERD) or other gastrointestinal conditions.  Asthma or other breathing conditions.  Restless legs syndrome, sleep apnea, or other sleep  disorders.  Chronic pain.  Menopause.  Stroke.  Abuse of alcohol, tobacco, or illegal drugs.  Mental health conditions, such as depression.  Caffeine.  Neurological disorders, such as Alzheimer's disease.  An overactive thyroid (hyperthyroidism). Sometimes, the cause of insomnia may not be known. What increases the risk? Risk factors for insomnia include:  Gender. Women are affected more often than men.  Age. Insomnia is more common as you get older.  Stress.  Lack of exercise.  Irregular work schedule or working night shifts.  Traveling between different time zones.  Certain medical and mental health conditions. What are the signs or symptoms? If you have insomnia, the main symptom is having trouble falling asleep or having trouble staying asleep. This may lead to other symptoms, such as:  Feeling fatigued or having low energy.  Feeling nervous about going to sleep.  Not feeling rested in the morning.  Having trouble concentrating.  Feeling irritable, anxious, or depressed. How is this diagnosed? This condition may be diagnosed based on:  Your symptoms and medical history. Your health care provider may ask about: ? Your sleep habits. ? Any medical conditions you have. ? Your mental health.  A physical exam. How is this treated? Treatment for insomnia depends on the cause. Treatment may focus on treating an underlying condition that is causing insomnia. Treatment may also include:  Medicines to help you sleep.  Counseling or therapy.  Lifestyle adjustments to help you sleep better. Follow these instructions at home: Eating and drinking  Limit or avoid alcohol, caffeinated beverages, and cigarettes, especially close to bedtime. These can disrupt your sleep.  Do not eat a large meal or eat spicy foods right before bedtime. This can lead to digestive discomfort that can make it hard for you to sleep. Sleep habits   Keep a sleep diary to help you  and your health care provider figure out what could be causing your insomnia. Write down: ? When you sleep. ? When you wake up during the night. ? How well you sleep. ? How rested you feel the next day. ? Any side effects of medicines you are taking. ? What you eat and drink.  Make your bedroom a dark, comfortable place where it is easy to fall asleep. ? Put up shades or blackout curtains to block light from outside. ? Use a white noise machine to block noise. ? Keep the temperature cool.  Limit screen use before bedtime. This includes: ? Watching TV. ? Using your smartphone, tablet, or computer.  Stick to a routine that includes going to bed and waking up at the same times every day and night. This can help you fall asleep faster. Consider making a quiet activity, such as reading, part of your nighttime routine.  Try to avoid taking naps during the day so that you sleep better at night.  Get out of bed if you are still awake after 15 minutes of trying to sleep. Keep the lights down, but try reading or doing a quiet activity. When you feel sleepy, go back to bed. General instructions  Take over-the-counter and prescription medicines only as told by your health care provider.  Exercise regularly, as told by your health care provider. Avoid exercise starting several hours before bedtime.  Use relaxation techniques to manage stress. Ask your health care provider to suggest some techniques that may work well for you. These may include: ? Breathing exercises. ? Routines to release muscle tension. ? Visualizing peaceful scenes.  Make sure that you drive carefully. Avoid driving if you feel very sleepy.  Keep all follow-up visits as told by your health care provider. This is important. Contact a health care provider if:  You are tired throughout the day.  You have trouble in your daily routine due to sleepiness.  You continue to have sleep problems, or your sleep problems get  worse. Get help right away if:  You have serious thoughts about hurting yourself or someone else. If you ever feel like you may hurt yourself or others, or have thoughts about taking your own life, get help right away. You can go to your nearest emergency department or call:  Your local emergency services (911 in the [place]  A suicide crisis helpline, such as the National Suicide Prevention Lifeline at (431)470-5346. This is open 24 hours a day. Summary  Insomnia is a sleep disorder that makes it difficult to fall asleep or stay asleep.  Insomnia can be long-term (chronic) or short-term (acute).  Treatment for insomnia depends on the cause. Treatment may focus on treating an underlying condition that is causing insomnia.  Keep a sleep diary to help you and your health care provider figure out what could be causing your insomnia. This information is not intended to replace advice given to you by your health care provider. Make sure you discuss any questions you have with your health care provider. Document Released: 01/19/2000 Document Revised: 10/31/2016 Document Reviewed: 10/31/2016 Elsevier Interactive Patient Education  2019 [employer]. Screening for Sleep Apnea  Sleep apnea is a condition in which breathing pauses or becomes shallow during sleep. Sleep apnea screening is a test to determine if you are at risk for sleep apnea. The test is easy and only takes a few minutes. Your health care provider may ask you to have this test in preparation for surgery or as part of a physical exam. What are the symptoms of sleep apnea? Common symptoms of sleep apnea include:  Snoring.  Restless sleep.  Daytime sleepiness.  Pauses in breathing.  Choking during sleep.  Irritability.  Forgetfulness.  Trouble thinking clearly.  Depression.  Personality changes. Most people with sleep apnea are not aware that they have it. Why should I get screened? Getting screened for sleep  apnea can help:  Ensure your safety. It is important for your health care providers to know whether or not you have sleep apnea, especially if you are having surgery or have other long-term (chronic) health conditions.  Improve your health and allow you to get a better night's rest. Restful sleep can help you: ? Have more energy. ? Lose weight. ? Improve high blood pressure. ? Improve diabetes management. ? Prevent stroke. ? Prevent car accidents. How is screening done? Screening usually includes being asked a list of questions about your sleep quality. Some questions you may be asked include:  Do you snore?  Is your sleep restless?  Do you have daytime sleepiness?  Has a partner or spouse told you that you stop breathing during sleep?  Have you had trouble concentrating or memory loss? If your screening test is positive, you are at risk for the condition. Further testing may be needed to confirm a diagnosis of sleep apnea. Where to find more information You can find screening tools online or at your health care clinic. For more information about sleep apnea screening and healthy sleep, visit these websites:  Centers for Disease Control and Prevention: Detail[employer].iswww.cdc.gov/sleep/index.html  American Sleep Apnea Association: www.sleepapnea.org Contact a health care provider if:  You think that you may have sleep apnea. Summary  Sleep apnea screening can help determine if you are at risk for sleep apnea.  It is important for your health care providers to know whether or not you have sleep apnea, especially if you are having surgery or have other chronic health conditions.  You may be asked to take a screening test for sleep apnea in preparation for surgery or as part of a physical exam. This information is not intended to replace advice given to you by your health care provider. Make sure you discuss any questions you have with your health care provider. Document Released: 05/03/2016  Document Revised: 05/03/2016 Document Reviewed: 05/03/2016 Elsevier Interactive Patient Education  2019 [employer]

## 2018-06-09 NOTE — Progress Notes (Signed)
SLEEP MEDICINE CLINIC   Provider:  Melvyn Novasarmen  Laquan Ludden, MD  Primary Care Physician:  Chilton GreathouseAvva, Ravisankar, MD   Referring Provider: Chilton GreathouseAvva, Ravisankar, MD    Virtual Visit via Video Note  I connected with Clinton Jackson on 06/09/18 at  3:00 PM EDT by a video enabled telemedicine application and verified that I am speaking with the correct person using two identifiers.  Location: Patient: at home , Provider: at her [facility] office.    I discussed the limitations of evaluation and management by telemedicine and the availability of in person appointments. The patient expressed understanding and agreed to proceed.  History of Present Illness: OSA diagnosis was made may 2015 and CPAP prescribed , but compliance was poor. In the meantime having diagnosis of DM 2.  Fluctuation in  weight up and down, currently 280 pounds.     HPI:  Clinton Jackson is a 43 y.o. male , seen here on 06-09-2018 by video in a referral from Dr. Felipa EthAvva for a new sleep consultation.    Chief complaint according to patient : can't use CPAP, gives you a sore throat. His nasal [oilloow and machine are 45 years old.!!!   Sleep and medical history: Reports that he lost weight 2018-2019 and now regained all of it. has been heavier than the average person for much of his adult life. As a young man he developed depression and has been in treatment. He is taking Wellbutrin twice a day, which surprised me in a patient with insomnia.  He continues to see a Veterinary surgeoncounselor.  When visiting his parents, his father stated that he knew his son was back in town- because his snoring :   "the Walls were shaking"=thunderously loud snoring.  The patient states that he has trouble falling asleep as he did 5 years ago , difficulties with  staying asleep and wakes up un-refreshed.  He has often a dry mouth and some headaches in the morning that he considers sinus pressure headaches. He always wished to sleep "another hour or two"  Mr.Nichol goes to bed  about midnight , needs an hour to fall asleep, he watches TV and feels this helps him to distract himself from daytime stress and worries. He wakes up 2 hors into his sleep time and than  goes to the bathroom , a total of 2 times at night. He may again 30 minutes to fall asleep again, and he watches TV. He rises at 9 in AM mostly spontaneously, has an alarm " to make sure" . He reports vivid dreams,"crazy nightmares ". He will wake up out of dream sleep from snoring, gasping for air.     Family medical: Father alive at age 45  HTN, father is part cherokee Bangladeshindian.                              Mother african american with  DM2 and HTN, brother [id number] and healthy , 3 sisters between 3952, 5750 and 45 years of                                  age. OSA in brother, both parents on CPAP, too.    Sleep history: meanwhile ( 5 year interval ) diagnosed as DM 2, obesity, followed by Dr Felipa EthAvva, 6 month ago Hb A1c was 10, and he started ComorosFarxiga . Gastric ulcers begun 10  years ago, gallstones- Dr Dulce Sellar.  No surgery yet.    Social history: single, no ETOH - no Tobacco, caffeine diet sodas- tea with artificial sweeteners. 4 cups a day. He did exercise until winter 2019,  He has trouble to self pace through his day. He lives and sleeps alone.  No children.  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 Not a Runner, broadcasting/film/video.    Sleep habits are as follows: sleeps alone, lives alone- when visiting his parents in [place] they report him snoring. dinner time is 6.30, bedtime is 11 PM- asleep within 1.5 hours. Tossing and turning, and playing on his cell phone.  Sleeps on his left side, no CPAP use. On 2 pillows. Nocturia times 2-3 times, rises at 8.30 AM , by alarm.  works now from home ( coronavirus pandemia) , logs in at 9 AM.  No shift work history.     Review of Systems: Out of a complete 14 system review, the patient complains of only the following symptoms, and all other reviewed systems are negative.  How likely are you to  doze in the following situations: 0 = not likely, 1 = slight chance, 2 = moderate chance, 3 = high chance  Sitting and Reading? 0 Watching Television?  3 Sitting inactive in a public place (theater or meeting)? 1 Lying down in the afternoon when circumstances permit?3 Sitting and talking to someone? 0 Sitting quietly after lunch without alcohol 2 In a car, while stopped for a few minutes in traffic? 0 As a passenger in a car for an hour without a break? 0  Total = 9 / 24    Snoring, nocturia, no dry mouth in am, but sinus headaches or migraine in AM. Right eye pressure.   Social History   Socioeconomic History  . Marital status: Single    Spouse name: Not on file  . Number of children: Not on file  . Years of education: Event organiser  . Highest education level: Not on file  Occupational History  . Occupation: Runner, broadcasting/film/video    Comment: Ship broker HS    Employer: [employer]  Social Needs  . Financial resource strain: Not on file  . Food insecurity:    Worry: Not on file    Inability: Not on file  . Transportation needs:    Medical: Not on file    Non-medical: Not on file  Tobacco Use  . Smoking status: Never Smoker  . Smokeless tobacco: Never Used  Substance and Sexual Activity  . Alcohol use: No  . Drug use: No  . Sexual activity: Not on file  Lifestyle  . Physical activity:    Days per week: Not on file    Minutes per session: Not on file  . Stress: Not on file  Relationships  . Social connections:    Talks on phone: Not on file    Gets together: Not on file    Attends religious service: Not on file    Active member of club or organization: Not on file    Attends meetings of clubs or organizations: Not on file    Relationship status: Not on file  . Intimate partner violence:    Fear of current or ex partner: Not on file    Emotionally abused: Not on file    Physically abused: Not on file    Forced sexual activity: Not on file  Other Topics Concern  . Not on  file  Social History Narrative   Patient  is single.   Patient drinks three cups of tea daily and one soda daily.   Patient is a Physicist, medical.   Patient has a Scientist, water quality (2).   Patient is right-handed.    Family History  Problem Relation Age of Onset  . Diabetes Mellitus II Mother   . Arthritis/Rheumatoid Mother   . Hypertension Mother   . Sleep apnea Mother   . Hypertension Father   . Coronary artery disease Father   . Ulcers Father   . Hyperlipidemia Father   . Sleep apnea Father     Past Medical History:  Diagnosis Date  . Depression   . Diabetes mellitus without complication (HCC)   . Hyperlipidemia   . Hypertension   . Multiple gastric ulcers   . Primary snoring 05/07/2013  . Severe obesity (BMI >= 40) (HCC) 05/07/2013    Past Surgical History:  Procedure Laterality Date  . ORCHIOPEXY      Current Outpatient Medications  Medication Sig Dispense Refill  . amLODipine (NORVASC) 10 MG tablet 1 tablet daily.    Marland Kitchen buPROPion (WELLBUTRIN SR) 200 MG 12 hr tablet Take 200 mg by mouth 2 (two) times daily.     Marland Kitchen FARXIGA 10 MG TABS tablet TAKE 1 TABLET BY MOUTH ONCE DAILY IN THE MORNING    . FLUoxetine (PROZAC) 40 MG capsule Take 40 mg by mouth 2 (two) times daily.     . fluticasone (FLONASE) 50 MCG/ACT nasal spray 2 sprays daily.    . hydrochlorothiazide (HYDRODIURIL) 25 MG tablet 1 tablet daily.    . meloxicam (MOBIC) 15 MG tablet Take 15 mg by mouth daily.    . metFORMIN (GLUCOPHAGE-XR) 500 MG 24 hr tablet Take 1,000 mg by mouth 2 (two) times daily.    Marland Kitchen omeprazole (PRILOSEC) 40 MG capsule TAKE 1 CAPSULE BY MOUTH TWICE DAILY BEFORE MEAL(S)    . propranolol (INDERAL) 20 MG tablet TAKE 1 TABLET BY MOUTH ONCE DAILY AS NEEDED FOR ANXIETY     No current facility-administered medications for this visit.     Allergies as of 06/09/2018 - Review Complete 06/08/2018  Allergen Reaction Noted  . Penicillins Rash 05/07/2013    Vitals: There were no vitals taken for this visit.  Last Weight:  Wt Readings from Last 1 Encounters:  02/20/15 280 lb (127 kg)   RUE:AVWUJ is no height or weight on file to calculate BMI.     Last Height:   Ht Readings from Last 1 Encounters:  12/05/14  (1.803 m)   Observations/Objective:    General: The patient is awake, alert and appears not in acute distress. The patient is well groomed. Head: Normocephalic, atraumatic. Neck is supple. Mallampati 3,  neck circumference:18" .  Nasal airflow - patent can hold breath up to 20 seconds. , TMJ is evident .   Cardiovascular:   without distended neck veins. Respiratory: breath holding  Skin:  Without evidence of facial edema, or rash Trunk: BMI is 39.5 kg/m2.   The patient's posture is erect. Neurologic exam : The patient is awake and alert, oriented to place and time.   Memory subjective described as intact.   Attention span & concentration ability appears normal.  Speech is fluent,  without  dysarthria, dysphonia or aphasia.  Mood and affect are appropriate.  Cranial nerves: Pupils are equal - Extraocular movements  in vertical and horizontal planes intact .  Facial motor strength is symmetric and tongue and uvula move midline. Shoulder shrug was symmetrical.  Motor exam: symmetric ROM  in all extremities.  Coordination: Rapid alternating movements in the fingers/hands was normal. Finger-to-nose maneuver  normal without evidence of ataxia, dysmetria or tremor.  Gait and station: Patient walks without assistive device    Follow Up Instructions:   Repeat sleep study and re-evaluate degree and type of apnea.  His nocturia can be related to DM and to OSA as well.  he has poor sleep hygiene.  Working and playing on his smart phone in bed, delayed sleep phase.   The patient has never gotten CPAP supplies because of low compliance.  He likes nasal pillows. DME [employer].  He likes his S 10 , but is concerned about costs with a new machine- will need to check out if he can  just use the current one with new supplies-    I discussed the assessment and treatment plan with the patient. The patient was provided an opportunity to ask questions and all were answered. The patient agreed with the plan and demonstrated an understanding of the instructions.   The patient was advised to call back or seek an in-person evaluation if the symptoms worsen or if the condition fails to improve as anticipated.  I provided 28 minutes of non-face-to-face time during this encounter.   Melvyn Novas, MD   Melvyn Novas, MD 06/09/2018, 2:56 PM  Certified in Neurology by ABPN Certified in Sleep Medicine by [facility] [address] [place], [place] [address]

## 2018-06-10 ENCOUNTER — Telehealth: Payer: Self-pay | Admitting: Neurology

## 2018-06-10 DIAGNOSIS — R0683 Snoring: Secondary | ICD-10-CM

## 2018-06-10 DIAGNOSIS — G4733 Obstructive sleep apnea (adult) (pediatric): Secondary | ICD-10-CM

## 2018-06-10 NOTE — Telephone Encounter (Signed)
[employer] state does not cover a home sleep study. Please place an order for the pt to have an in lab.

## 2018-06-10 NOTE — Addendum Note (Signed)
Addended by: Judi Cong on: 06/10/2018 09:36 AM   Modules accepted: Orders

## 2018-07-01 ENCOUNTER — Telehealth: Payer: Self-pay

## 2018-07-01 NOTE — Telephone Encounter (Signed)
Called patient to schedule in lab sleep study. Pt stated he does not feel comfortable having in lab study. Pt states he was told by MD that he could do a HST. Informed pt that his insurance carrier does not approve a HST as valid testing and therefore does not authorize payment for it. Told patient that insurance is only authorizing in lab study. Offered patient information about our process on the in lab study and pt stated that he is aware due to having two studies in the past. Did offer patient to have HST but pt is responsible for payment of that study. Pt denied both and stated that he will call back at a later time to schedule when ready.

## 2018-11-17 ENCOUNTER — Other Ambulatory Visit: Payer: Self-pay | Admitting: Gastroenterology

## 2018-11-17 DIAGNOSIS — R748 Abnormal levels of other serum enzymes: Secondary | ICD-10-CM

## 2018-11-25 ENCOUNTER — Ambulatory Visit
Admission: RE | Admit: 2018-11-25 | Discharge: 2018-11-25 | Disposition: A | Discharge status: Home / Self Care | Payer: BC Managed Care – PPO | Source: Ambulatory Visit | Attending: Gastroenterology | Admitting: Gastroenterology

## 2018-11-25 DIAGNOSIS — R748 Abnormal levels of other serum enzymes: Secondary | ICD-10-CM

## 2020-05-16 ENCOUNTER — Other Ambulatory Visit: Payer: Self-pay | Admitting: Orthopedic Surgery

## 2020-05-16 DIAGNOSIS — S46219A Strain of muscle, fascia and tendon of other parts of biceps, unspecified arm, initial encounter: Secondary | ICD-10-CM

## 2020-06-03 ENCOUNTER — Ambulatory Visit
Admission: RE | Admit: 2020-06-03 | Discharge: 2020-06-03 | Disposition: A | Discharge status: Home / Self Care | Payer: BC Managed Care – PPO | Source: Ambulatory Visit | Attending: Orthopedic Surgery | Admitting: Orthopedic Surgery

## 2020-06-03 DIAGNOSIS — S46219A Strain of muscle, fascia and tendon of other parts of biceps, unspecified arm, initial encounter: Secondary | ICD-10-CM

## 2021-05-15 ENCOUNTER — Ambulatory Visit
Admission: RE | Admit: 2021-05-15 | Discharge: 2021-05-15 | Disposition: A | Discharge status: Home / Self Care | Payer: BC Managed Care – PPO | Source: Ambulatory Visit | Attending: Gastroenterology | Admitting: Gastroenterology

## 2021-05-15 ENCOUNTER — Other Ambulatory Visit: Payer: Self-pay | Admitting: Gastroenterology

## 2021-05-15 DIAGNOSIS — R195 Other fecal abnormalities: Secondary | ICD-10-CM

## 2021-05-15 DIAGNOSIS — R1011 Right upper quadrant pain: Secondary | ICD-10-CM

## 2021-06-18 ENCOUNTER — Ambulatory Visit: Payer: BC Managed Care – PPO | Admitting: Internal Medicine

## 2021-06-18 ENCOUNTER — Encounter: Payer: Self-pay | Admitting: Internal Medicine

## 2021-06-18 ENCOUNTER — Other Ambulatory Visit (HOSPITAL_COMMUNITY): Payer: Self-pay

## 2021-06-18 VITALS — BP 116/84 | HR 90 | Ht 71.0 in | Wt 245.6 lb

## 2021-06-18 DIAGNOSIS — R0683 Snoring: Secondary | ICD-10-CM | POA: Diagnosis not present

## 2021-06-18 DIAGNOSIS — G4733 Obstructive sleep apnea (adult) (pediatric): Secondary | ICD-10-CM | POA: Diagnosis not present

## 2021-06-18 DIAGNOSIS — I10 Essential (primary) hypertension: Secondary | ICD-10-CM

## 2021-06-18 DIAGNOSIS — E119 Type 2 diabetes mellitus without complications: Secondary | ICD-10-CM

## 2021-06-18 DIAGNOSIS — E782 Mixed hyperlipidemia: Secondary | ICD-10-CM

## 2021-06-18 DIAGNOSIS — R072 Precordial pain: Secondary | ICD-10-CM

## 2021-06-18 MED ORDER — IVABRADINE HCL 5 MG PO TABS
10.0000 mg | ORAL_TABLET | Freq: Once | ORAL | 0 refills | Status: AC
  Filled 2021-06-18: qty 2, 1d supply, fill #0

## 2021-06-18 MED ORDER — METOPROLOL TARTRATE 100 MG PO TABS
100.0000 mg | ORAL_TABLET | Freq: Once | ORAL | 0 refills | Status: DC
  Filled 2021-06-18: qty 1, 1d supply, fill #0

## 2021-06-18 NOTE — Progress Notes (Signed)
?Cardiology Office Note:   ? ?Date:  06/18/2021  ? ?ID:  QUETZAL VERRIER, DOB Oct 29, 1975, MRN [id number] ? ?PCP:  Chilton Greathouse, MD  ?Cardiologist:  Parke Poisson, MD  ?Electrophysiologist:  None  ? ?Referring MD: Chilton Greathouse, MD  ? ?Chief Complaint/Reason for Referral: ?Chest pain ? ?History of Present Illness:   ? ?Clinton Jackson is a 46 y.o. male with a history of hypertension, hyperlipidemia, diabetes mellitus, gastric ulcers, and obesity, here for the evaluation of atypical chest pain. ? ?He was seen in the [facility]) 07/04/2020 with concerns for sudden onset left-sided chest pain subsequent to a stressful situation. His chest pain was consistent with pleuritic pain; EKG's did not suggest pericarditis per report, I cannot see these ECGs to verify. Troponin was elevated x1 at 0.042 but initial check and subsequent to this were within normal limits. D-dimer minimally elevated. He had a chest CT that showed no coronary calcification by report, I cannot see images to verify, but he did have a left mid lung opacity. His Echo showed borderline LV function during sinus tachycardia. Supportive management for pleuritic/chest wall pain was recommended and he was referred to outpatient cardiology follow up. ? ?Referral notes from Dr. Chilton Greathouse reviewed. At their appointment on 05/07/2021 he presented with some chest discomfort and diaphoresis, but this wasn't as severe as his episode occurring 3 days prior in the setting of an argument. His EKG showed NSR with no acute changes. He was not using his CPAP, instead taking albuterol at night to help him sleep. He also reported worsening IBS. Receives testosterone injections. His chest pain was thought to be of GI or musculoskeletal etiology, but he was referred for cardiology consult due to multiple risk factors including a strong family history of cardiovascular disease. His father was noted to have had a CABG, AAA. ? ?Today: ?He complains of  central chest soreness with his breathing every once in a while or when he wakes up in the morning. Normally this occurs at night or may wake him up from sleep.  The quality of his chest pain has not changed he also notices chest discomfort if he is upset. Last year he experienced chest pains in the same central chest area, possibly thought to be due to walking pneumonia. Of note, while talking on the phone he has been told his breathing sounds rough. ? ?He admits to not using his CPAP at night. This causes sore throat, and he has been trying to obtain some updated equipment.  ? ?Also he notes pain in his right leg, but not while he is walking. 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 When he returns home and relaxes he then notices the R leg pain. During the day, he may be in his office or walking around the school. This walking or his commute would be the most strenuous activities recently. He does not do much formal exercise.  Symptoms not consistent with claudication or rest pain. ? ?The patient denies palpitations, PND, or leg swelling. Denies cough, fever, chills. Denies nausea, vomiting. Denies syncope or presyncope. Denies dizziness or lightheadedness. ? ?Previously he was seen in cardiology for "chest complications". In his family, his grandmother died of a heart issue. His younger sister has a septal defect. His father had aortic surgery when he was in his 31's. His mother has hypertension and diabetes. ? ? ?Past Medical History:  ?Diagnosis Date  ? Depression   ? Diabetes mellitus without  complication ([place])   ? Hyperlipidemia   ? Hypertension   ? Multiple gastric ulcers   ? Primary snoring 05/07/2013  ? Severe obesity (BMI >= 40) ([place]) 05/07/2013  ? ? ?Past Surgical History:  ?Procedure Laterality Date  ? ORCHIOPEXY    ? ? ?Current Medications: ?Current Meds  ?Medication Sig  ? albuterol (VENTOLIN HFA) 108 (90 Base) MCG/ACT inhaler   ? amLODipine (NORVASC) 10 MG tablet 1 tablet daily.  ? anastrozole  (ARIMIDEX) 1 MG tablet Take 1 tablet by mouth once a week.  ? buPROPion (WELLBUTRIN SR) 200 MG 12 hr tablet Take 200 mg by mouth 2 (two) times daily.   ? FARXIGA 10 MG TABS tablet TAKE 1 TABLET BY MOUTH ONCE DAILY IN THE MORNING  ? FLUoxetine (PROZAC) 40 MG capsule Take 40 mg by mouth 2 (two) times daily.   ? fluticasone (FLONASE) 50 MCG/ACT nasal spray 2 sprays daily.  ? hydrochlorothiazide (HYDRODIURIL) 25 MG tablet 1 tablet daily.  ? ivabradine (CORLANOR) 5 MG TABS tablet Take 2 tablets by mouth once for 1 dose. PLEASE TAKE THIS 1 HOUR PRIOR TO CCTA SCAN  ? metFORMIN (GLUCOPHAGE-XR) 500 MG 24 hr tablet Take 1,000 mg by mouth 2 (two) times daily.  ? metoprolol tartrate (LOPRESSOR) 100 MG tablet Take 1 tablet by mouth once for 1 dose. PLEASE TAKE METOPROLOL 2  HOURS PRIOR TO CTA SCAN.  ? pantoprazole (PROTONIX) 40 MG tablet   ? rosuvastatin (CRESTOR) 5 MG tablet Take 5 mg by mouth daily.  ? sildenafil (REVATIO) 20 MG tablet TAKE 1 TO 4 TABLETS BY MOUTH AS NEEDED ONCE DAILY PRIOR TO SEXUAL INTERCOURSE  ?  ? ?Allergies:   Penicillins  ? ?Social History  ? ?Tobacco Use  ? Smoking status: Never  ? Smokeless tobacco: Never  ?Substance Use Topics  ? Alcohol use: No  ? Drug use: No  ?  ? ?Family History: ?The patient's family history includes Arthritis/Rheumatoid in his mother; Coronary artery disease in his father; Diabetes Mellitus II in his mother; Hyperlipidemia in his father; Hypertension in his father and mother; Sleep apnea in his father and mother; Ulcers in his father. ? ?ROS:   ?Please see the history of present illness.    ?(+) Chest pain ?(+) Right LE pain ?All other systems reviewed and are negative. ? ?EKGs/Labs/Other Studies Reviewed:   ? ?The following studies were reviewed today: ? ?Bilateral LE Venous Doppler 07/05/2020 ([place]): ?IMPRESSION:  ?No evidence of bilateral lower extremity deep vein thrombosis.  ? ?Echo 07/05/2020 ([place]): ?Summary  ?  1. The left ventricle is normal in size  with mildly increased wall  ?thickness.  ?  2. The left ventricular systolic function is borderline, LVEF is visually  ?estimated at 50%.  ?  3. The right ventricle is probably normal in size, with normal systolic  ?function.  ?  4. The left atrium is mildly to moderately dilated in size.  ?  5. Pulmonary systolic pressure cannot be estimated due to insufficient TR jet.  ?  6. Rhythm: Tachycardia.  ?  7. Technically difficult study.  ?  8. Echo contrast utilized to enhance endocardial border definition.  ? ?CTA Chest 07/04/2020 ([place]): ?FINDINGS:  ?Cardiovascular: No filling defects in the pulmonary arteries to  ?suggest pulmonary emboli. Heart is normal size. Aorta is normal  ?caliber.  ? ?Mediastinum/Nodes: No mediastinal, hilar, or axillary adenopathy.  ?Trachea and esophagus are unremarkable. Thyroid unremarkable.  ? ?Lungs/Pleura: Airspace opacity  peripherally in the left upper lobe  ?concerning for developing pneumonia. No effusions. Right lung clear.  ? ?Upper Abdomen: Imaging into the upper abdomen demonstrates no acute  ?findings.  ? ?Musculoskeletal: Chest wall soft tissues are unremarkable. No acute  ?bony abnormality.  ? ?Review of the MIP images confirms the above findings.  ? ?IMPRESSION:  ?No evidence of pulmonary embolus.  ? ?Peripheral airspace opacity in the left upper lobe concerning for  ?developing pneumonia.  ? ? ?EKG:  EKG is personally reviewed. ?06/18/2021:  Sinus rhythm. Rate 90 bpm. ? ?Imaging studies that I have independently reviewed today: n/a ? ?Recent Labs: ?No results found for requested labs within last 8760 hours.  ? ?Recent Lipid Panel ?No results found for: CHOL, TRIG, HDL, CHOLHDL, VLDL, LDLCALC, LDLDIRECT ? ?Physical Exam:   ? ?VS:  BP 116/84   Pulse 90   Ht 5\' 11"  (1.803 m)   Wt 245 lb 9.6 oz (111.4 kg)   SpO2 99%   BMI 34.25 kg/m?    ? ?Wt Readings from Last 5 Encounters:  ?06/18/21 245 lb 9.6 oz (111.4 kg)  ?02/20/15 280 lb (127 kg)  ?12/05/14 276 lb (125.2 kg)   ?06/10/13 289 lb (131.1 kg)  ?05/07/13 289 lb (131.1 kg)  ?  ?Constitutional: No acute distress ?Eyes: sclera non-icteric, normal conjunctiva and lids ?ENMT: normal dentition, moist mucous membranes ?[facility]

## 2021-06-18 NOTE — Patient Instructions (Addendum)
Medication Instructions:  ?PLEASE TAKE METOPROLOL TARTRATE 100mg  TWO HOURS PRIOR TO CCTA SCAN  ? ?TAKE IVABRADINE 10mg  ONE HOUR PRIOR TO CCTA SCAN  ?*If you need a refill on your cardiac medications before your next appointment, please call your pharmacy* ? ?Lab Work: ?BMET- BLOOD WORK TODAY  ?If you have labs (blood work) drawn today and your tests are completely normal, you will receive your results only by: ?MyChart Message (if you have MyChart) OR ?A paper copy in the mail ?If you have any lab test that is abnormal or we need to change your treatment, we will call you to review the results. ? ?Testing/Procedures: ? ? ?Your cardiac CT will be scheduled at one of the below locations:  ? ?[facility] ?[address] ?[place], [address] [place] ?[phone] ? ?If scheduled at [facility], please arrive at the [facility] Entrance (Entrance C2) of [facility] 30 minutes prior to test start time. ?You can use the FREE valet parking offered at entrance C (encouraged to control the heart rate for the test)  ?Proceed to the [facility] Radiology Department (first floor) to check-in and test prep. ? ?All radiology patients and guests should use entrance C2 at [facility], accessed from [facility], even though the hospital's physical address listed is [address] ? ? ? ? ?Please follow these instructions carefully (unless otherwise directed): ? ?PLEASE DO NOT TAKE ERECTILE DYSFUNCTION MEDICATIONS 72 HOURS PRIOR TO CTA  ? ?On the Night Before the Test: ?Be sure to Drink plenty of water. ?Do not consume any caffeinated/decaffeinated beverages or chocolate 12 hours prior to your test. ?Do not take any antihistamines 12 hours prior to your test. ? ?On the Day of the Test: ?Drink plenty of water until 1 hour prior to the test. ?Do not eat any food 4 hours prior to the test. ?You may take your regular medications prior to the test.  ?Take metoprolol  (Lopressor) two hours prior to test. ?HOLD Furosemide/Hydrochlorothiazide morning of the test. ?FEMALES- please wear underwire-free bra if available, avoid dresses & tight clothing ?     ?After the Test: ?Drink plenty of water. ?After receiving IV contrast, you may experience a mild flushed feeling. This is normal. ?On occasion, you may experience a mild rash up to 24 hours after the test. This is not dangerous. If this occurs, you can take Benadryl 25 mg and increase your fluid intake. ?If you experience trouble breathing, this can be serious. If it is severe call 911 IMMEDIATELY. If it is mild, please call our office. ?If you take any of these medications: Glipizide/Metformin, Avandament, Glucavance, please do not take 48 hours after completing test unless otherwise instructed. ? ?We will call to schedule your test 2-4 weeks out understanding that some insurance companies will need an authorization prior to the service being performed.  ? ?For non-scheduling related questions, please contact the cardiac imaging nurse navigator should you have any questions/concerns: ?[facility], Cardiac Imaging Nurse Navigator ?[address], Cardiac Imaging Nurse Navigator ?[facility] ?Direct Office Dial: [phone]  ? ?For scheduling needs, including cancellations and rescheduling, please call Larey Brick, [phone]. ? ?Follow-Up: ?At [facility], you and your health needs are our priority.  As part of our continuing mission to provide you with exceptional heart care, we have created designated Provider Care Teams.  These Care Teams include your primary Cardiologist (physician) and Advanced Practice Providers (APPs -  Physician  Assistants and Nurse Practitioners) who all work together to provide you with the care you need, when you need it. ? ?We recommend signing up for the patient portal called "MyChart".  Sign up information is provided on this After Visit Summary.  MyChart is used to  connect with patients for Virtual Visits (Telemedicine).  Patients are able to view lab/test results, encounter notes, upcoming appointments, etc.  Non-urgent messages can be sent to your provider as well.   ?To learn more about what you can do with MyChart, go to [email or web address].   ? ?Your next appointment:   ?June 8th at 10:00am ? ?The format for your next appointment:   ?In Person ? ?Provider:   ?Parke Poisson, MD   ? ? ? ? ? ? ?  ?

## 2021-06-19 LAB — BASIC METABOLIC PANEL
BUN/Creatinine Ratio: 9 (ref 9–20)
BUN: 9 mg/dL (ref 6–24)
CO2: 26 mmol/L (ref 20–29)
Calcium: 10.2 mg/dL (ref 8.7–10.2)
Chloride: 95 mmol/L — ABNORMAL LOW (ref 96–106)
Creatinine, Ser: 1.03 mg/dL (ref 0.76–1.27)
Glucose: 138 mg/dL — ABNORMAL HIGH (ref 70–99)
Potassium: 4.2 mmol/L (ref 3.5–5.2)
Sodium: 139 mmol/L (ref 134–144)
eGFR: 90 mL/min/{1.73_m2} (ref >59)

## 2021-07-12 ENCOUNTER — Ambulatory Visit: Payer: BC Managed Care – PPO | Admitting: Internal Medicine

## 2021-07-16 ENCOUNTER — Ambulatory Visit (HOSPITAL_COMMUNITY): Admission: RE | Admit: 2021-07-16 | Payer: BC Managed Care – PPO | Source: Ambulatory Visit

## 2021-07-17 ENCOUNTER — Ambulatory Visit: Payer: BC Managed Care – PPO | Admitting: Physician Assistant

## 2021-08-06 ENCOUNTER — Ambulatory Visit (INDEPENDENT_AMBULATORY_CARE_PROVIDER_SITE_OTHER): Payer: BC Managed Care – PPO | Admitting: Neurology

## 2021-08-06 ENCOUNTER — Encounter: Payer: Self-pay | Admitting: Neurology

## 2021-08-06 VITALS — BP 123/86 | HR 85 | Ht 71.0 in | Wt 240.0 lb

## 2021-08-06 DIAGNOSIS — R634 Abnormal weight loss: Secondary | ICD-10-CM | POA: Insufficient documentation

## 2021-08-06 DIAGNOSIS — R071 Chest pain on breathing: Secondary | ICD-10-CM | POA: Insufficient documentation

## 2021-08-06 DIAGNOSIS — G4733 Obstructive sleep apnea (adult) (pediatric): Secondary | ICD-10-CM

## 2021-08-06 DIAGNOSIS — I10 Essential (primary) hypertension: Secondary | ICD-10-CM | POA: Diagnosis not present

## 2021-08-06 DIAGNOSIS — K7581 Nonalcoholic steatohepatitis (NASH): Secondary | ICD-10-CM

## 2021-08-06 DIAGNOSIS — D599 Acquired hemolytic anemia, unspecified: Secondary | ICD-10-CM | POA: Insufficient documentation

## 2021-08-06 NOTE — Addendum Note (Signed)
Addended by: Melvyn Novas on: 08/06/2021 10:09 AM   Modules accepted: Orders

## 2021-08-06 NOTE — Patient Instructions (Signed)
Weight loss of 40 pounds, still needing CPAP?Marland Kitchen High red bc, and hemoglobin/ hematocrit. Hypoxia?   Screening for Sleep Apnea  Sleep apnea is a condition in which breathing pauses or becomes shallow during sleep. Sleep apnea screening is a test to determine if you are at risk for sleep apnea. The test includes a series of questions. It will only takes a few minutes. Your health care provider may ask you to have this test in preparation for surgery or as part of a physical exam. What are the symptoms of sleep apnea? Common symptoms of sleep apnea include: Snoring. Waking up often at night. Daytime sleepiness. Pauses in breathing. Choking or gasping during sleep. Irritability. Forgetfulness. Trouble thinking clearly. Depression. Personality changes. Most people with sleep apnea do not know that they have it. What are the advantages of sleep apnea screening? Getting screened for sleep apnea can help: Ensure your safety. It is important for your health care providers to know whether or not you have sleep apnea, especially if you are having surgery or have other long-term (chronic) health conditions. Improve your health and allow you to get a better night's rest. Restful sleep can help you: Have more energy. Lose weight. Improve high blood pressure. Improve diabetes management. Prevent stroke. Prevent car accidents. What happens during the screening? Screening usually includes being asked a list of questions about your sleep quality. Some questions you may be asked include: Do you snore? Is your sleep restless? Do you have daytime sleepiness? Has a partner or spouse told you that you stop breathing during sleep? Have you had trouble concentrating or memory loss? What is your age? What is your neck circumference? To measure your neck, keep your back straight and gently wrap the tape measure around your neck. Put the tape measure at the middle of your neck, between your chin and  collarbone. What is your sex assigned at birth? Do you have or are you being treated for high blood pressure? If your screening test is positive, you are at risk for the condition. Further testing may be needed to confirm a diagnosis of sleep apnea. Where to find more information You can find screening tools online or at your health care clinic. For more information about sleep apnea screening and healthy sleep, visit these websites: Centers for Disease Control and Prevention: FootballExhibition.com.br American Sleep Apnea Association: www.sleepapnea.org Contact a health care provider if: You think that you may have sleep apnea. Summary Sleep apnea screening can help determine if you are at risk for sleep apnea. It is important for your health care providers to know whether or not you have sleep apnea, especially if you are having surgery or have other chronic health conditions. You may be asked to take a screening test for sleep apnea in preparation for surgery or as part of a physical exam. This information is not intended to replace advice given to you by your health care provider. Make sure you discuss any questions you have with your health care provider. Document Revised: 12/31/2019 Document Reviewed: 12/31/2019 Elsevier Patient Education  2023 Elsevier Inc. High red bc, and hemoglobin/ hematocrit. Hypoxia?

## 2021-08-06 NOTE — Progress Notes (Signed)
SLEEP MEDICINE CLINIC    Provider:  Melvyn Novas, MD  Primary Care Physician:  Chilton Greathouse, MD [address] [place] [address]     Referring Provider: Chilton Greathouse, Md [address] [place],  [place] [address]          Chief Complaint according to patient   Patient presents with:     New Patient (Initial Visit)     NX - DOH (2020) / Paper proficient / hx OSA, needs new cpap supplies / AVVA, RAVISANKAR.      HISTORY OF PRESENT ILLNESS:   08-06-21.  Clinton Jackson is a 46 y.o.  African American male patient seen here in his first face to face visit on 08/06/2021.Weight loss of 40 pounds, still needing CPAP?Marland Kitchen High red bc, and hemoglobin/ hematocrit. Hypoxia?   Clinton Jackson , was seen here on 06-09-2018 by video in a referral from Dr. Felipa Eth for a new sleep consultation.    History of Present Illness: OSA diagnosis was made May 2015 and CPAP prescribed , but compliance was poor.  There for it was difficult to get supplies. In the meantime he had received a diagnosis of DM 2.  Fluctuation in  weight up and down, he was at 280 pounds when I had a virtual visit on 06-09-2018, now he had lost 40 pounds, weights 240 in clothing. His Hb A 1 c has improved, but his CPAP compliance has not.  He uses a nasal pillow , P 10 from [employer]. No chin strap- he wakes with a sore throat.  Chief complaint according to patient : can't use CPAP-compliantly, gives me a sore throat. The machine is 48 years old.!!! Weight loss of 40 pounds, still needing CPAP?Marland Kitchen High red bc, and hemoglobin/ hematocrit. Hypoxia?     I have the pleasure of seeing Clinton Jackson today, a right-handed [place] American male with a possible sleep disorder.  She has a  has a past medical history of Depression, Diabetes mellitus without complication (HCC), Hyperlipidemia, Hypertension, Multiple gastric ulcers, Primary snoring (05/07/2013), and history of Severe obesity (BMI >= 40) (HCC) (05/07/2013).   The  patient had the first sleep study in the year 2008 at [facility].    Sleep relevant medical history: Nocturia- none since CPAP, but prior he had to go every 2 hours.    No Tonsillectomy,  deviated septum and sinusitis , but no repair. Headaches from sinus congestion. Headaches wake him, he will take OTC medication.   Family medical /sleep history: Both parents on CPAP with OSA.   Social history:  Patient is working as Optician, dispensing and lives in a household alone. Family status is single. Foster parent in the past. The patient currently works. Tobacco use: none ETOH use ; seldomly,  Caffeine intake in form of Coffee( /) Soda( /) Tea ( sweet).  Regular exercise in form of gym twice a week. 2 hours a week. .   Hobbies : walking      Sleep habits are as follows: The patient's dinner time is between 6-8 PM. The patient goes to bed at 11 PM and continues to sleep for 6  hours, wakes for 1 bathroom break.   The preferred sleep position is sideways, with the support of 2 pillows.  Dreams are reportedly frequent.  5.30  AM is the usual rise time. The patient wakes up with an alarm.  He reports not always feeling refreshed or restored in AM, with symptoms  such as dry mouth, morning headaches, chest pressure, and residual fatigue.  Naps are taken frequently, lasting from 2-2.5 hours  and are more refreshing than nocturnal sleep- he commutes to [facility] daily , 60-90 minutes.     Review of Systems: Out of a complete 14 system review, the patient complains of only the following symptoms, and all other reviewed systems are negative.:  Fatigue, sleepiness , snoring, fragmented sleep, headache may wake him, waking sometimes with headaches. Dry nose, dry mouth,    How likely are you to doze in the following situations: 0 = not likely, 1 = slight chance, 2 = moderate chance, 3 = high chance   Sitting and Reading? Watching Television? Sitting inactive in a public place (theater or  meeting)? As a passenger in a car for an hour without a break? Lying down in the afternoon when circumstances permit? Sitting and talking to someone? Sitting quietly after lunch without alcohol? In a car, while stopped for a few minutes in traffic?   Total = 9/ 24 points   FSS endorsed at 32/ 63 points.   Social History   Socioeconomic History   Marital status: Single    Spouse name: Not on file   Number of children: Not on file   Years of education: Masters-2   Highest education level: Not on file  Occupational History   Occupation: Teacher    Comment: [employer]    Employer: [employer]  Tobacco Use   Smoking status: Never   Smokeless tobacco: Never  Substance and Sexual Activity   Alcohol use: No   Drug use: No   Sexual activity: Not on file  Other Topics Concern   Not on file  Social History Narrative   Patient is single.   Patient drinks three cups of tea daily and one soda daily.   Patient is a Physicist, medical.   Patient has a Scientist, water quality (2).   Patient is right-handed.   Social Determinants of Health   Financial Resource Strain: Not on file  Food Insecurity: Not on file  Transportation Needs: Not on file  Physical Activity: Not on file  Stress: Not on file  Social Connections: Not on file    Family History  Problem Relation Age of Onset   Diabetes Mellitus II Mother    Arthritis/Rheumatoid Mother    Hypertension Mother    Sleep apnea Mother    Hypertension Father    Coronary artery disease Father    Ulcers Father    Hyperlipidemia Father    Sleep apnea Father     Past Medical History:  Diagnosis Date   Depression    Diabetes mellitus without complication (HCC)    Hyperlipidemia    Hypertension    Multiple gastric ulcers    Primary snoring 05/07/2013   Severe obesity (BMI >= 40) (HCC) 05/07/2013    Past Surgical History:  Procedure Laterality Date   ORCHIOPEXY       Current Outpatient Medications on File Prior to Visit  Medication  Sig Dispense Refill   albuterol (VENTOLIN HFA) 108 (90 Base) MCG/ACT inhaler      amLODipine (NORVASC) 10 MG tablet 1 tablet daily.     anastrozole (ARIMIDEX) 1 MG tablet Take 1 tablet by mouth once a week.     buPROPion (WELLBUTRIN SR) 200 MG 12 hr tablet Take 200 mg by mouth 2 (two) times daily.      FARXIGA 10 MG TABS tablet TAKE 1 TABLET BY MOUTH  ONCE DAILY IN THE MORNING     FLUoxetine (PROZAC) 40 MG capsule Take 40 mg by mouth 2 (two) times daily.      fluticasone (FLONASE) 50 MCG/ACT nasal spray 2 sprays daily.     hydrochlorothiazide (HYDRODIURIL) 25 MG tablet 1 tablet daily.     metFORMIN (GLUCOPHAGE-XR) 500 MG 24 hr tablet Take 1,000 mg by mouth 2 (two) times daily.     pantoprazole (PROTONIX) 40 MG tablet      rosuvastatin (CRESTOR) 5 MG tablet Take 5 mg by mouth daily.     sildenafil (REVATIO) 20 MG tablet TAKE 1 TO 4 TABLETS BY MOUTH AS NEEDED ONCE DAILY PRIOR TO SEXUAL INTERCOURSE     No current facility-administered medications on file prior to visit.    Allergies  Allergen Reactions   Penicillins Rash    Physical exam:  Today's Vitals   08/06/21 0908  BP: 123/86  Pulse: 85  Weight: 240 lb (108.9 kg)  Height: 5\' 11"  (1.803 m)   Body mass index is 33.47 kg/m.   Wt Readings from Last 3 Encounters:  08/06/21 240 lb (108.9 kg)  06/18/21 245 lb 9.6 oz (111.4 kg)  02/20/15 280 lb (127 kg)     Ht Readings from Last 3 Encounters:  08/06/21 5\' 11"  (1.803 m)  06/18/21 5\' 11"  (1.803 m)  12/05/14 5\' 11"  (1.803 m)      General: The patient is awake, alert and appears not in acute distress. The patient is well groomed. Head: Normocephalic, atraumatic. Neck is supple.  Mallampati 2- with a very narrowed airway- exaggerated gag reflex. ,  neck circumference:18.75 inches . Nasal airflow patent.  Retrognathia is not  seen.  Dental status: biological - he had wisdom teeth extracted.  Cardiovascular:  Regular rate and cardiac rhythm by pulse,  without distended neck  veins. Respiratory: Lungs are clear to auscultation.  Skin:  Without evidence of ankle edema, or rash. Trunk: The patient's posture is erect.   Neurologic exam : The patient is awake and alert, oriented to place and time.   Memory subjective described as intact.  Attention span & concentration ability appears normal.  Speech is fluent,  without  dysarthria, dysphonia or aphasia.  Mood and affect are appropriate.   Cranial nerves: no loss of smell or taste reported  Pupils are equal and briskly reactive to light. Funduscopic exam deferred.  Extraocular movements in vertical and horizontal planes were intact and without nystagmus. No Diplopia. Visual fields by finger perimetry are intact. Hearing was intact to soft voice and finger rubbing.    Facial sensation intact to fine touch.  Facial motor strength is symmetric and tongue and uvula move midline.  Neck ROM : rotation, tilt and flexion extension were normal for age and shoulder shrug was symmetrical.    Motor exam:  Symmetric bulk, tone and ROM.   Normal tone without cog- wheeling, symmetric grip strength .   Sensory:  Fine touch, pinprick and vibration were tested  and  normal.  Proprioception tested in the upper extremities was normal.   Coordination: Rapid alternating movements in the fingers/hands were of normal speed.  The Finger-to-nose maneuver was intact without evidence of ataxia, dysmetria or tremor.   Gait and station: Patient could rise unassisted from a seated position, walked without assistive device.  Stance is of normal width/ base .  Toe and heel walk were deferred.  Deep tendon reflexes: in the  upper and lower extremities are symmetric and intact.  Babinski  response was deferred.       After spending a total time of  35  minutes face to face and additional time for physical and neurologic examination, review of laboratory studies,  personal review of imaging studies, reports and results of other testing  and review of referral information / records as far as provided in visit, I have established the following assessments:  My last encounter with Quinzell Malcomb Was a little over 3 years ago in a video guided visit during the pandemic 2020.  He has been evaluated on Jun 18, 2021 by cardiology for chest pain in the setting of type 2 diabetes mellitus, obstructive sleep apnea, hypertension and mixed hyperlipidemia and coronary calcium CT was suggested.  Has not yet been done.  In April he had developed significant chest pain.  Depression and fatigue have improved on the fluoxetine and Wellbutrin.  He takes hydrochlorothiazide 25 mg in the morning once a day which does cause some frequent urination in daytime.  He had been diagnosed with a nonalcoholic fatty liver, NASH, but his liver enzymes are now in completely normal range.  For example ALT is down from 233 units/L to 15.  AST down from 172 units/L to 10 units/L.  This comprehensive metabolic panel showed only the fasting glucose to be elevated still at 130 as of July last year and in April of this year it was 109.  CBC with differential was reviewed white blood cell count was normal at 9.46 lymphocytes in April was slightly elevated.  Hemoglobin 18.8 hematocrit 55.3 these are elevations that could indicate hypoxia at night.  Since the patient is not on testosterone ( but was until JMay 2023 !!!)  or any other erythrocyte producing medications we will concentrate on ruling out nocturnal hypoxemia as this may also explain some of the dry mouth the chest pressure and some morning headaches.  I like to add that he could not get supplies on a regular basis because of compliance issues but I would like for him to undergo a repeat sleep study in form of a home sleep test and then we will find hopefully a better interface for him if he prefers to continue with a nasal pillow he may need a chinstrap.  Weight loss of 40 pounds certainly warrants retesting.  1)  chest pain,  headaches, dry mouth 2)   HCT / hemoglobin elevated.  3) completely reversed liver enzymes, and much improved fasting glucose.    My Plan is to proceed with:  1) HST  2) rule out hypoxia as a cause for erythrocythemia, hct/ hgl elevation, but he also confessed to testosterone use until May - prescribed "[facility] " Weight loss and hormone therapy clinic  3) evaluate for residual OSA   I would like to thank Chilton Greathouse, MD and Chilton Greathouse, Md [address] [place],  [place] [address] for allowing me to meet with and to take care of this pleasant patient.   In short, JA OHMAN is presenting with CPAP non-compliance and an 48 year old machine , he lost a significant amount of weight.  I plan to follow up either personally or through our NP within 2-3 months.   CC: I will share my notes with PCP.  Electronically signed by: Melvyn Novas, MD 08/06/2021 9:19 AM  [facility] Board certified by The [employer] of Sleep Medicine and Diplomate of the [employer] of Sleep Medicine. Board certified In Neurology through the ABPN,  Fellow of the [employer] of Neurology. Medical Director of [employer].

## 2021-08-08 ENCOUNTER — Telehealth: Payer: Self-pay | Admitting: Neurology

## 2021-08-08 NOTE — Telephone Encounter (Signed)
Split- [employer] [place] no auth req patient is scheduled at [facility] for 08/21/21 at 9 pm. Mailed packet out as well.

## 2021-08-17 NOTE — Telephone Encounter (Signed)
Pt would like a call back for clarity on appt for Sleep lab.

## 2021-08-27 NOTE — Telephone Encounter (Signed)
Noted, Clinton Jackson spoke to the patient on 08/20/21. He wanted to schedule a HST- he is scheduled for 09/11/21 at 8:30 AM.

## 2021-08-30 NOTE — Telephone Encounter (Signed)
Patient called stating he cannot make it to the 09/11/21 appointment.. he r/s for 09/12/21 at 8:30 AM.

## 2021-08-31 ENCOUNTER — Encounter (HOSPITAL_COMMUNITY): Payer: Self-pay

## 2021-09-09 ENCOUNTER — Other Ambulatory Visit: Payer: Self-pay | Admitting: Pediatrics

## 2021-09-12 ENCOUNTER — Ambulatory Visit: Payer: BC Managed Care – PPO | Admitting: Neurology

## 2021-09-12 DIAGNOSIS — R071 Chest pain on breathing: Secondary | ICD-10-CM

## 2021-09-12 DIAGNOSIS — I10 Essential (primary) hypertension: Secondary | ICD-10-CM

## 2021-09-12 DIAGNOSIS — D599 Acquired hemolytic anemia, unspecified: Secondary | ICD-10-CM

## 2021-09-12 DIAGNOSIS — G4733 Obstructive sleep apnea (adult) (pediatric): Secondary | ICD-10-CM | POA: Diagnosis not present

## 2021-09-12 DIAGNOSIS — R634 Abnormal weight loss: Secondary | ICD-10-CM

## 2021-09-13 NOTE — Progress Notes (Signed)
[facility] SLEEP TEST REPORT ( by Watch PAT)   STUDY DATE:  09-13-2021 DOB:  07/15/1975    ORDERING CLINICIAN: Melvyn Novas, MD  REFERRING CLINICIAN:  Larina Earthly, MD, PhD   CLINICAL INFORMATION/HISTORY: 08-06-2021, a new visit for Clinton Jackson , who was seen here first on 06-09-2018 during pandemic restrictions by video upon referral from Dr. Felipa Eth for a new sleep consultation.      History of Present Illness: The OSA diagnosis was made in May 2015 and CPAP was prescribed , but compliance was poor- it was difficult to get supplies. In the meantime ( 8 years)  he had received a diagnosis of DM 2.  Fluctuation in weight noted:  he was at 280 pounds when we had a virtual visit on 06-09-2018, now he had lost 40 pounds, weights 240 in clothing. His Hb A 1 c has improved, but his CPAP compliance has not.  He uses a nasal pillow , P 10 from [employer]. No chin strap- he wakes with a sore throat.  Chief complaint according to patient : I can't use CPAP-compliantly, it gives me a sore throat.  (The machine is 48 years old and he hasn't had any newer supplies) Weight loss of 40 pounds, still needing CPAP? High RBC, and elevated hemoglobin/ hematocrit. Can this be due to Hypoxia?    Epworth sleepiness score:  9/24.   BMI:33.6 kg/m   Neck Circumference: 19"   FINDINGS:   Sleep Summary:   Total Recording Time (hours, min):     7 hours and 58 minutes of which total sleep time amounted to 7 hours 19 minutes.  REM sleep was present for 22% of sleep time.                  Respiratory Indices:   Calculated pAHI (per hour):    17.6/h                         REM pAHI:     28.1/h                                            NREM pAHI:   14.7/h  The RDI was 21.6/h total, RDI in rem sleep was 32.5/h, RDI in non-REM sleep was 18.5/h.                           Positional AHI: The patient slept circuit 270 minutes in supine position with an AHI of 15.5, followed  by right lateral sleep with an AHI of 29/h in prone sleep with an AHI of 10.6/h  Snoring level reached a mean volume of 41 dB and was present for 22% of the total sleep time.                                                 Oxygen Saturation Statistics:      O2 Saturation Range (%):     Between a nadir of 87% and a maximum of 100% saturation with a mean saturation of 96%  O2 Saturation (minutes) <89%:   0.2 minutes        Pulse Rate Statistics:   Pulse Mean (bpm): 79 bpm                 Pulse Range:    Minimum 53 bpm maximum heart rate 106 bpm             IMPRESSION:  This HST confirms the presence of REM sleep accentuated ( not REM sleep dependent) obstructive sleep apnea without hypoxemia.  The AHI was in mild- moderate range. Moderate snoring was recorded.  Sleep architecture was not highly fragmented and the sleep latency was short at 19 minutes, REM sleep latency was in normal range at 56 minutes.   RECOMMENDATION: I understand that [name] has struggled with CPAP use, but I do think that he would do best with another trial.  I also want him to continue his weight loss journey hopefully reaching a BMI under 30 in the process.   The patient likes to sleep prone which often interferes with his CPAP interface he presented with a rare circumstance were supine sleep and prone sleep produced significantly less apneas in comparison to AHI in lateral sleep positions.  My first choice would be to try auto titration CPAP, with a setting between 6 and 16 cm water pressure, 2 cm EPR and an interface that has to be fitted to the patient in person- and best in reclined position. I want [name] to work with CPAP therapy for 2 to 3 months before we meet and if he still has an unsatisfying sleep quality I would recommend to change to a dental device.  His apnea is too mild to justify a surgical intervention such as an inspire implant. Since no hypoxia was found I do  not have an explanation for his elevated red blood cell count.    INTERPRETING PHYSICIAN:   Melvyn Novas, MD   Medical Director of [facility].

## 2021-09-21 NOTE — Addendum Note (Signed)
Addended by: Melvyn Novas on: 09/21/2021 12:20 PM   Modules accepted: Orders

## 2021-09-21 NOTE — Progress Notes (Signed)
IMPRESSION:  This HST confirms the presence of REM sleep accentuated ( not REM sleep dependent) obstructive sleep apnea without hypoxemia.  The AHI was in mild- moderate range. Moderate snoring was recorded.  Sleep architecture was not highly fragmented and the sleep latency was short at 19 minutes, REM sleep latency was in normal range at 56 minutes.  RECOMMENDATION: I understand that Mr. Clinton Jackson has struggled with CPAP use, but I do think that he would do best with another trial.  I also want him to continue his weight loss journey hopefully reaching a BMI under 30 in the process.   The patient likes to sleep prone which often interferes with his CPAP interface he presented with a rare circumstance were supine sleep and prone sleep produced significantly less apneas in comparison to AHI in lateral sleep positions.  My first choice would be to try auto titration CPAP, with a setting between 6 and 16 cm water pressure, 2 cm EPR and an interface that has to be fitted to the patient in person- and best in reclined position. I want Mr. Clinton Jackson to work with CPAP therapy for 2 to 3 months before we meet and if he still reports an unsatisfying sleep quality I would recommend to change to a dental device- with the possible reduction of AHI by 50%.   His apnea is too mild to justify a surgical intervention such as an inspire implant. Since no hypoxia was found, I do not have an explanation for his elevated red blood cell count.

## 2021-09-21 NOTE — Procedures (Signed)
               [facility]   HOME SLEEP TEST REPORT ( by Watch PAT)   STUDY DATE:  09-13-2021 DOB:  04/26/1975    ORDERING CLINICIAN: Nykerria Macconnell, MD  REFERRING CLINICIAN:  Ravi Avva, MD, PhD   CLINICAL INFORMATION/HISTORY: 08-06-2021, a new visit for Clinton Jackson , who was seen here first on 06-09-2018 during pandemic restrictions by video upon referral from Dr. Avva for a new sleep consultation.      History of Present Illness: The OSA diagnosis was made in May 2015 and CPAP was prescribed , but compliance was poor- it was difficult to get supplies. In the meantime ( 8 years)  he had received a diagnosis of DM 2.  Fluctuation in weight noted:  he was at 280 pounds when we had a virtual visit on 06-09-2018, now he had lost 40 pounds, weights 240 in clothing. His Hb A 1 c has improved, but his CPAP compliance has not.  He uses a nasal pillow , P 10 from [employer]. No chin strap- he wakes with a sore throat.  Chief complaint according to patient : I can't use CPAP-compliantly, it gives me a sore throat.  (The machine is 48 years old and he hasn't had any newer supplies) Weight loss of 40 pounds, still needing CPAP? High RBC, and elevated hemoglobin/ hematocrit. Can this be due to Hypoxia?    Epworth sleepiness score:  9/24.   BMI:33.6 kg/m   Neck Circumference: 19"   FINDINGS:   Sleep Summary:   Total Recording Time (hours, min):     7 hours and 58 minutes of which total sleep time amounted to 7 hours 19 minutes.  REM sleep was present for 22% of sleep time.                  Respiratory Indices:   Calculated pAHI (per hour):    17.6/h                         REM pAHI:     28.1/h                                            NREM pAHI:   14.7/h  The RDI was 21.6/h total, RDI in rem sleep was 32.5/h, RDI in non-REM sleep was 18.5/h.                           Positional AHI: The patient slept circuit 270 minutes in supine position with an AHI of 15.5, followed  by right lateral sleep with an AHI of 29/h in prone sleep with an AHI of 10.6/h  Snoring level reached a mean volume of 41 dB and was present for 22% of the total sleep time.                                                 Oxygen Saturation Statistics:      O2 Saturation Range (%):     Between a nadir of 87% and a maximum of 100% saturation with a mean saturation of 96%                                    O2 Saturation (minutes) <89%:   0.2 minutes        Pulse Rate Statistics:   Pulse Mean (bpm): 79 bpm                 Pulse Range:    Minimum 53 bpm maximum heart rate 106 bpm             IMPRESSION:  This HST confirms the presence of REM sleep accentuated ( not REM sleep dependent) obstructive sleep apnea without hypoxemia.  The AHI was in mild- moderate range. Moderate snoring was recorded.  Sleep architecture was not highly fragmented and the sleep latency was short at 19 minutes, REM sleep latency was in normal range at 56 minutes.   RECOMMENDATION: I understand that [name] has struggled with CPAP use, but I do think that he would do best with another trial.  I also want him to continue his weight loss journey hopefully reaching a BMI under 30 in the process.   The patient likes to sleep prone which often interferes with his CPAP interface he presented with a rare circumstance were supine sleep and prone sleep produced significantly less apneas in comparison to AHI in lateral sleep positions.  My first choice would be to try auto titration CPAP, with a setting between 6 and 16 cm water pressure, 2 cm EPR and an interface that has to be fitted to the patient in person- and best in reclined position. I want [name] to work with CPAP therapy for 2 to 3 months before we meet and if he still has an unsatisfying sleep quality I would recommend to change to a dental device.  His apnea is too mild to justify a surgical intervention such as an inspire implant. Since no hypoxia was found I do  not have an explanation for his elevated red blood cell count.    INTERPRETING PHYSICIAN:   Deni Berti, MD   Medical Director of [facility].             

## 2021-09-26 ENCOUNTER — Telehealth: Payer: Self-pay | Admitting: Neurology

## 2021-09-26 NOTE — Telephone Encounter (Signed)
I called pt. I advised pt that Dr. Vickey Huger reviewed their sleep study results and found that pt sleep apnea. Dr. Vickey Huger recommends that pt starts auto CPAP. I reviewed PAP compliance expectations with the pt. Pt is agreeable to starting a CPAP. I advised pt that an order will be sent to a DME, [facility], and [facility] will call the pt within about one week after they file with the pt's insurance. [facility] will show the pt how to use the machine, fit for masks, and troubleshoot the CPAP if needed. A follow up appt was made for insurance purposes with Dr. Vickey Huger on 11/29/2021 at 3:30 pm. Pt verbalized understanding to arrive 15 minutes early and bring their CPAP. A letter with all of this information in it will be mailed to the pt as a reminder. I verified with the pt that the address we have on file is correct. Pt verbalized understanding of results. Pt had no questions at this time but was encouraged to call back if questions arise. I have sent the order to [facility] and have received confirmation that they have received the order.

## 2021-09-26 NOTE — Telephone Encounter (Signed)
-----   Message from Melvyn Novas, MD sent at 09/21/2021 12:20 PM EDT ----- IMPRESSION:  This HST confirms the presence of REM sleep accentuated ( not REM sleep dependent) obstructive sleep apnea without hypoxemia.  The AHI was in mild- moderate range. Moderate snoring was recorded.  Sleep architecture was not highly fragmented and the sleep latency was short at 19 minutes, REM sleep latency was in normal range at 56 minutes.  RECOMMENDATION: I understand that Clinton Jackson has struggled with CPAP use, but I do think that he would do best with another trial.  I also want him to continue his weight loss journey hopefully reaching a BMI under 30 in the process.   The patient likes to sleep prone which often interferes with his CPAP interface he presented with a rare circumstance were supine sleep and prone sleep produced significantly less apneas in comparison to AHI in lateral sleep positions.  My first choice would be to try auto titration CPAP, with a setting between 6 and 16 cm water pressure, 2 cm EPR and an interface that has to be fitted to the patient in person- and best in reclined position. I want Clinton Jackson to work with CPAP therapy for 2 to 3 months before we meet and if he still reports an unsatisfying sleep quality I would recommend to change to a dental device- with the possible reduction of AHI by 50%.   His apnea is too mild to justify a surgical intervention such as an inspire implant. Since no hypoxia was found, I do not have an explanation for his elevated red blood cell count.

## 2021-10-30 NOTE — Telephone Encounter (Signed)
FYI pt was called , he agreed to cancel the 10-02 appointment with Jinny Blossom, NP and will keep the initial CPAP f/u with Dr Brett Fairy.  Pt is aware to bring CPAP and Power cord

## 2021-11-05 ENCOUNTER — Ambulatory Visit: Payer: BC Managed Care – PPO | Admitting: Adult Health

## 2021-11-15 ENCOUNTER — Telehealth (HOSPITAL_COMMUNITY): Payer: Self-pay | Admitting: Psychiatry

## 2021-11-15 NOTE — Telephone Encounter (Signed)
D:  Pauline Good, NP referred pt to [facility].  A:  Placed call to orient pt, but there was no answer.  Left vm for pt to call the case manager back.  Inform Pauline Good, NP or Tiffany Control and instrumentation engineer at [employer]).

## 2021-11-22 NOTE — Telephone Encounter (Signed)
FYI: pt cancelled Initial CPAP appt due to having issues with [employer] and [employer]. Pt said will call back once have cleared up things with insurance. Informed pt would need to reschedule between 11/24/21-01/23/22 in order for insurance to cover cost of CPAP machine. Pt verbalized understand.

## 2021-11-29 ENCOUNTER — Ambulatory Visit: Payer: Self-pay | Admitting: Neurology

## 2021-12-25 ENCOUNTER — Encounter: Payer: Self-pay | Admitting: Neurology

## 2021-12-25 ENCOUNTER — Ambulatory Visit (INDEPENDENT_AMBULATORY_CARE_PROVIDER_SITE_OTHER): Payer: Self-pay | Admitting: Neurology

## 2021-12-25 VITALS — BP 117/74 | HR 99 | Ht 71.0 in | Wt 242.5 lb

## 2021-12-25 DIAGNOSIS — R519 Headache, unspecified: Secondary | ICD-10-CM

## 2021-12-25 DIAGNOSIS — R0683 Snoring: Secondary | ICD-10-CM

## 2021-12-25 DIAGNOSIS — G4733 Obstructive sleep apnea (adult) (pediatric): Secondary | ICD-10-CM

## 2021-12-25 MED ORDER — FEXOFENADINE HCL 60 MG PO TABS: 60.0000 mg | ORAL_TABLET | Freq: Every day | ORAL | Status: AC

## 2021-12-25 NOTE — Patient Instructions (Signed)
CPAP and BIPAP Information CPAP and BIPAP are methods that use air pressure to keep your airways open and to help you breathe well. CPAP and BIPAP use different amounts of pressure. Your health care provider will tell you whether CPAP or BIPAP would be more helpful for you. CPAP stands for "continuous positive airway pressure." With CPAP, the amount of pressure stays the same while you breathe in (inhale) and out (exhale). BIPAP stands for "bi-level positive airway pressure." With BIPAP, the amount of pressure will be higher when you inhale and lower when you exhale. This allows you to take larger breaths. CPAP or BIPAP may be used in the hospital, or your health care provider may want you to use it at home. You may need to have a sleep study before your health care provider can order a machine for you to use at home. What are the advantages? CPAP or BIPAP can be helpful if you have: Sleep apnea. Chronic obstructive pulmonary disease (COPD). Heart failure. Medical conditions that cause muscle weakness, including muscular dystrophy or amyotrophic lateral sclerosis (ALS). Other problems that cause breathing to be shallow, weak, abnormal, or difficult. CPAP and BIPAP are most commonly used for obstructive sleep apnea (OSA) to keep the airways from collapsing when the muscles relax during sleep. What are the risks? Generally, this is a safe treatment. However, problems may occur, including: Irritated skin or skin sores if the mask does not fit properly. Dry or stuffy nose or nosebleeds. Dry mouth. Feeling gassy or bloated. Sinus or lung infection if the equipment is not cleaned properly. When should CPAP or BIPAP be used? In most cases, the mask only needs to be worn during sleep. Generally, the mask needs to be worn throughout the night and during any daytime naps. People with certain medical conditions may also need to wear the mask at other times, such as when they are awake. Follow instructions  from your health care provider about when to use the machine. What happens during CPAP or BIPAP?  Both CPAP and BIPAP are provided by a small machine with a flexible plastic tube that attaches to a plastic mask that you wear. Air is blown through the mask into your nose or mouth. The amount of pressure that is used to blow the air can be adjusted on the machine. Your health care provider will set the pressure setting and help you find the best mask for you. Tips for using the mask Because the mask needs to be snug, some people feel trapped or closed-in (claustrophobic) when first using the mask. If you feel this way, you may need to get used to the mask. One way to do this is to hold the mask loosely over your nose or mouth and then gradually apply the mask more snugly. You can also gradually increase the amount of time that you use the mask. Masks are available in various types and sizes. If your mask does not fit well, talk with your health care provider about getting a different one. Some common types of masks include: Full face masks, which fit over the mouth and nose. Nasal masks, which fit over the nose. Nasal pillow or prong masks, which fit into the nostrils. If you are using a mask that fits over your nose and you tend to breathe through your mouth, a chin strap may be applied to help keep your mouth closed. Use a skin barrier to protect your skin as told by your health care provider. Some CPAP   and BIPAP machines have alarms that may sound if the mask comes off or develops a leak. If you have trouble with the mask, it is very important that you talk with your health care provider about finding a way to make the mask easier to tolerate. Do not stop using the mask. There could be a negative impact on your health if you stop using the mask. Tips for using the machine Place your CPAP or BIPAP machine on a secure table or stand near an electrical outlet. Know where the on/off switch is on the  machine. Follow instructions from your health care provider about how to set the pressure on your machine and when you should use it. Do not eat or drink while the CPAP or BIPAP machine is on. Food or fluids could get pushed into your lungs by the pressure of the CPAP or BIPAP. For home use, CPAP and BIPAP machines can be rented or purchased through home health care companies. Many different brands of machines are available. Renting a machine before purchasing may help you find out which particular machine works well for you. Your health insurance company may also decide which machine you may get. Keep the CPAP or BIPAP machine and attachments clean. Ask your health care provider for specific instructions. Check the humidifier if you have a dry stuffy nose or nosebleeds. Make sure it is working correctly. Follow these instructions at home: Take over-the-counter and prescription medicines only as told by your health care provider. Ask if you can take sinus medicine if your sinuses are blocked. Do not use any products that contain nicotine or tobacco. These products include cigarettes, chewing tobacco, and vaping devices, such as e-cigarettes. If you need help quitting, ask your health care provider. Keep all follow-up visits. This is important. Contact a health care provider if: You have redness or pressure sores on your head, face, mouth, or nose from the mask or head gear. You have trouble using the CPAP or BIPAP machine. You cannot tolerate wearing the CPAP or BIPAP mask. Someone tells you that you snore even when wearing your CPAP or BIPAP. Get help right away if: You have trouble breathing. You feel confused. Summary CPAP and BIPAP are methods that use air pressure to keep your airways open and to help you breathe well. If you have trouble with the mask, it is very important that you talk with your health care provider about finding a way to make the mask easier to tolerate. Do not stop using  the mask. There could be a negative impact to your health if you stop using the mask. Follow instructions from your health care provider about when to use the machine. This information is not intended to replace advice given to you by your health care provider. Make sure you discuss any questions you have with your health care provider. Document Revised: 08/30/2020 Document Reviewed: 12/31/2019 Elsevier Patient Education  2023 Elsevier Inc. Fexofenadine Capsules or Tablets What is this medication? FEXOFENADINE (fex oh FEN a deen) prevents and treats allergy symptoms, such as red, itchy eyes, sneezing, a runny or stuffy nose, or hives. It works by blocking histamine, a substance released by the body during an allergic reaction. It belongs to a group of medications called antihistamines. This medicine may be used for other purposes; ask your health care provider or pharmacist if you have questions. COMMON BRAND NAME(S): Allegra, Allegra Allergy 12 Hour, Allegra Allergy 24 Hour, Allegra Children's Allergy, Allegra Hives, Allergy Relief What should  I tell my care team before I take this medication? They need to know if you have any of these conditions: Kidney disease An unusual or allergic reaction to fexofenadine, other medications, foods, dyes, or preservatives Pregnant or trying to get pregnant Breastfeeding How should I use this medication? Take this medication by mouth with a full glass of water. Follow the directions on the prescription label. You may take this medication with food or on an empty stomach. Take your medication at regular intervals. Do not take it more often than directed. You may need to take this medication for several days before your symptoms improve. Talk to your care team regarding the use of this medication in children. While this medication may be prescribed for children as young as 70 years old for selected conditions, precautions do apply. Overdosage: If you think you  have taken too much of this medicine contact a poison control center or emergency room at once. NOTE: This medicine is only for you. Do not share this medicine with others. What if I miss a dose? If you miss a dose, take it as soon as you can. If it is almost time for your next dose, take only that dose. Do not take double or extra doses. What may interact with this medication? Antacids Erythromycin Grapefruit, apple, or orange juice Ketoconazole Magnesium-containing products This list may not describe all possible interactions. Give your health care provider a list of all the medicines, herbs, non-prescription drugs, or dietary supplements you use. Also tell them if you smoke, drink alcohol, or use illegal drugs. Some items may interact with your medicine. What should I watch for while using this medication? Visit your care team for regular checks on your health. Tell your care team if your symptoms do not start to get better or if they get worse. What side effects may I notice from receiving this medication? Side effects that you should report to your care team as soon as possible: Allergic reactions or angioedema--skin rash, itching, hives, swelling of the face, eyes, lips, tongue, arms, or legs, trouble swallowing or breathing Side effects that usually do not require medical attention (report to your care team if they continue or are bothersome): Cough Drowsiness Dry mouth Headache Upset stomach This list may not describe all possible side effects. Call your doctor for medical advice about side effects. You may report side effects to FDA at 1-800-FDA-1088. Where should I keep my medication? Keep out of the reach of children and pets. Store at room temperature between 20 and 25 degrees C (68 and 77degrees F). Protect from moisture. Throw away any unused medication after the expiration date. NOTE: This sheet is a summary. It may not cover all possible information. If you have questions  about this medicine, talk to your doctor, pharmacist, or health care provider.  2023 Elsevier/Gold Standard (2020-03-02 00:00:00)

## 2021-12-25 NOTE — Progress Notes (Addendum)
SLEEP MEDICINE CLINIC    Provider:  Melvyn Novas, MD  Primary Care Physician:  Chilton Greathouse, MD [address] [place] [address]     Referring Provider: Chilton Greathouse, Md [address] [place],  [place] [address]          Chief Complaint according to patient   Patient presents with:     New Patient (Initial Visit)     NX - DOH (2020) / Paper proficient / hx OSA, needs new cpap supplies / [facility], RAVISANKAR.      HISTORY OF PRESENT ILLNESS:   11.21. 2023; Rv for HUBERT RAATZ, who  is a 46 y.o.  African American male sleep apnea patient seen here in his first face to face visit after a repeat sleep study, on 12/25/2021.machine was issued in September- 2023:.  Mr. Morais sleep test was a home sleep test performed on in August 2023, it confirmed the presence of REM sleep accentuated obstructive sleep apnea without hypoxia.  His overall AHI was 17.6/h in rem sleep 28.1/h and in non-REM sleep 14.7/h.  There was also snoring noted by RDI yet his mean volume was only 41 dB.  There was no significant hypoxia.  While I understand that Mr. Has struggled with CPAP use I wanted him to continue and also to hopefully on his weight loss journey reach a point where his apnea index will be much lower.  He has been using an AutoSet since mid September at this time it is only a compliance of 57% and on days used 7 hours and 22 minutes were recorded on average.   The minimum pressure was set to 6 and maximum to 16 cm of water pressure was 1 expiratory relief.  His residual AHI is 4.3 and these are more central (!)  than obstructive apneas.  The 95th percentile pressure is 13 cm water.    The patient asked today to reduce the starting pressure and we will start with a minimum pressure of 4 cm water with a protracted ramp.  We will increase his EPR to 2 cmH2O to make exhalation easy.     08-06-21.  SHELVY PERAZZO is a 46 y.o.  African American male patient seen here in his first  face to face visit on 12/25/2021.Weight loss of 40 pounds, still needing CPAP?Marland Kitchen High red bc, and hemoglobin/ hematocrit. Hypoxia?   TROI FLORENDO , was seen here on 06-09-2018 by video in a referral from Dr. Felipa Eth for a new sleep consultation.    History of Present Illness: OSA diagnosis was made May 2015 and CPAP prescribed , but compliance was poor.  There for it was difficult to get supplies. In the meantime he had received a diagnosis of DM 2.  Fluctuation in  weight up and down, he was at 280 pounds when I had a virtual visit on 06-09-2018, now he had lost 40 pounds, weights 240 in clothing. His Hb A 1 c has improved, but his CPAP compliance has not.  He uses a nasal pillow , P 10 from [employer]. No chin strap- he wakes with a sore throat.  Chief complaint according to patient : can't use CPAP-compliantly, gives me a sore throat. The machine is 48 years old.!!! Weight loss of 40 pounds, still needing CPAP?Marland Kitchen High red bc, and hemoglobin/ hematocrit. Hypoxia?     I have the pleasure of seeing KONA LOVER today, a right-handed [place] American male with a possible sleep disorder.  She has a  has a past medical history of Depression, Diabetes mellitus without complication (HCC), Hyperlipidemia, Hypertension, Multiple gastric ulcers, Primary snoring (05/07/2013), and history of Severe obesity (BMI >= 40) (HCC) (05/07/2013).   The patient had the first sleep study in the year 2008 at [facility]    Sleep relevant medical history: Nocturia- none since CPAP, but prior he had to go every 2 hours.    No Tonsillectomy,  deviated septum and sinusitis , but no repair. Headaches from sinus congestion. Headaches wake him, he will take OTC medication.   Family medical /sleep history: Both parents on CPAP with OSA.   Social history:  Patient is working as Optician, dispensing and lives in a household alone. Family status is single. Foster parent in the past. The patient currently works. Tobacco use:  none ETOH use ; seldomly,  Caffeine intake in form of Coffee( /) Soda( /) Tea ( sweet).  Regular exercise in form of gym twice a week. 2 hours a week. .   Hobbies : walking      Sleep habits are as follows: The patient's dinner time is between 6-8 PM. The patient goes to bed at 11 PM and continues to sleep for 6  hours, wakes for 1 bathroom break.   The preferred sleep position is sideways, with the support of 2 pillows.  Dreams are reportedly frequent.  5.30  AM is the usual rise time. The patient wakes up with an alarm.  He reports not always feeling refreshed or restored in AM, with symptoms such as dry mouth, morning headaches, chest pressure, and residual fatigue.  Naps are taken frequently, lasting from 2-2.5 hours  and are more refreshing than nocturnal sleep- he commutes to [facility] daily , 60-90 minutes.     Review of Systems: Out of a complete 14 system review, the patient complains of only the following symptoms, and all other reviewed systems are negative.:  Fatigue, sleepiness , snoring, fragmented sleep, headache may wake him, waking sometimes with headaches. Dry nose, dry mouth, all improved with CPAP.  Sleeps before midnight.    How likely are you to doze in the following situations: 0 = not likely, 1 = slight chance, 2 = moderate chance, 3 = high chance   Sitting and Reading? Watching Television? Sitting inactive in a public place (theater or meeting)? As a passenger in a car for an hour without a break? Lying down in the afternoon when circumstances permit? Sitting and talking to someone? Sitting quietly after lunch without alcohol? In a car, while stopped for a few minutes in traffic?   Total = 6 from 9/ 24 points   FSS endorsed at now on CPAP 20- from 32/ 63 points. PHQR: 2/ 9    Social History   Socioeconomic History   Marital status: Single    Spouse name: Not on file   Number of children: Not on file   Years of education: Masters-2   Highest  education level: Not on file  Occupational History   Occupation: Teacher    Comment: [employer]    Employer: [employer]  Tobacco Use   Smoking status: Never   Smokeless tobacco: Never  Substance and Sexual Activity   Alcohol use: No   Drug use: No   Sexual activity: Not on file  Other Topics Concern   Not on file  Social History Narrative   Patient is single.   Patient drinks three cups of tea daily and  one soda daily.   Patient is a Physicist, medical.   Patient has a Scientist, water quality (2).   Patient is right-handed.   Social Determinants of Health   Financial Resource Strain: Not on file  Food Insecurity: Not on file  Transportation Needs: Not on file  Physical Activity: Not on file  Stress: Not on file  Social Connections: Not on file    Family History  Problem Relation Age of Onset   Diabetes Mellitus II Mother    Arthritis/Rheumatoid Mother    Hypertension Mother    Sleep apnea Mother    Hypertension Father    Coronary artery disease Father    Ulcers Father    Hyperlipidemia Father    Sleep apnea Father     Past Medical History:  Diagnosis Date   Depression    Diabetes mellitus without complication (HCC)    Hyperlipidemia    Hypertension    Multiple gastric ulcers    Primary snoring 05/07/2013   Severe obesity (BMI >= 40) (HCC) 05/07/2013    Past Surgical History:  Procedure Laterality Date   ORCHIOPEXY       Current Outpatient Medications on File Prior to Visit  Medication Sig Dispense Refill   albuterol (VENTOLIN HFA) 108 (90 Base) MCG/ACT inhaler      amLODipine (NORVASC) 10 MG tablet 1 tablet daily.     anastrozole (ARIMIDEX) 1 MG tablet Take 1 tablet by mouth once a week.     buPROPion (WELLBUTRIN SR) 200 MG 12 hr tablet Take 200 mg by mouth 2 (two) times daily.      FARXIGA 10 MG TABS tablet TAKE 1 TABLET BY MOUTH ONCE DAILY IN THE MORNING     FLUoxetine (PROZAC) 40 MG capsule Take 40 mg by mouth 2 (two) times daily.      fluticasone (FLONASE)  50 MCG/ACT nasal spray 2 sprays daily.     hydrochlorothiazide (HYDRODIURIL) 25 MG tablet 1 tablet daily.     metFORMIN (GLUCOPHAGE-XR) 500 MG 24 hr tablet Take 1,000 mg by mouth 2 (two) times daily.     pantoprazole (PROTONIX) 40 MG tablet      rosuvastatin (CRESTOR) 5 MG tablet Take 5 mg by mouth daily.     sildenafil (REVATIO) 20 MG tablet TAKE 1 TO 4 TABLETS BY MOUTH AS NEEDED ONCE DAILY PRIOR TO SEXUAL INTERCOURSE     No current facility-administered medications on file prior to visit.    Allergies  Allergen Reactions   Penicillins Rash    Physical exam:  Today's Vitals   12/25/21 1315  BP: 117/74  Pulse: 99  Weight: 242 lb 8 oz (110 kg)  Height: 5\' 11"  (1.803 m)   Body mass index is 33.82 kg/m.   Wt Readings from Last 3 Encounters:  12/25/21 242 lb 8 oz (110 kg)  08/06/21 240 lb (108.9 kg)  06/18/21 245 lb 9.6 oz (111.4 kg)     Ht Readings from Last 3 Encounters:  12/25/21 5\' 11"  (1.803 m)  08/06/21 5\' 11"  (1.803 m)  06/18/21 5\' 11"  (1.803 m)      General: The patient is awake, alert and appears not in acute distress. The patient is well groomed. Head: Normocephalic, atraumatic. Neck is supple.  Mallampati 2- with a very narrowed airway- exaggerated gag reflex. ,  neck circumference:18.75 inches . Nasal airflow patent.  Retrognathia is not  seen.  Dental status: biological - he had wisdom teeth extracted.  Cardiovascular:  Regular rate and cardiac rhythm by pulse,  without distended neck veins. Respiratory: Lungs are clear to auscultation.  Skin:  Without evidence of ankle edema, or rash. Trunk: The patient's posture is erect.   Neurologic exam : The patient is awake and alert, oriented to place and time.   Memory subjective described as intact.  Attention span & concentration ability appears normal.  Speech is fluent,  without  dysarthria, dysphonia or aphasia.  Mood and affect are appropriate.   Cranial nerves: no loss of smell or taste reported   Pupils are equal and briskly reactive to light. Funduscopic exam deferred.  Extraocular movements in vertical and horizontal planes were intact and without nystagmus. No Diplopia. Visual fields by finger perimetry are intact. Hearing was intact to soft voice and finger rubbing.    Facial sensation intact to fine touch.  Facial motor strength is symmetric and tongue and uvula move midline.  Neck ROM : rotation, tilt and flexion extension were normal for age and shoulder shrug was symmetrical.    Motor exam:  Symmetric bulk, tone and ROM.   Normal tone without cog- wheeling, symmetric grip strength .   Sensory:  Fine touch, pinprick and vibration were tested  and  normal.  Proprioception tested in the upper extremities was normal.   Coordination: Rapid alternating movements in the fingers/hands were of normal speed.  The Finger-to-nose maneuver was intact without evidence of ataxia, dysmetria or tremor.   Gait and station: Patient could rise unassisted from a seated position, walked without assistive device.  Stance is of normal width/ base .  Toe and heel walk were deferred.  Deep tendon reflexes: in the  upper and lower extremities are symmetric and intact.  Babinski response was deferred.       After spending a total time of  35  minutes face to face and additional time for physical and neurologic examination, review of laboratory studies,  personal review of imaging studies, reports and results of other testing and review of referral information / records as far as provided in visit, I have established the following assessments:   1)  chest pain, morning headaches, dry mouth- all this  has improved under new CPAP auto device.  He has endorsed less sleepiness and less fatigue, continues to lose weight.  2)   HCT / hemoglobin elevated, but unrelated to hypoxia  see HST  from August 2023. OSA was mild -moderate. REM sleep apnea dominant.   3) completely reversed liver enzymes, and  much improved fasting glucose with achieved weight loss. .    My Plan is to proceed with:  1) continue treatment of mild - moderate OSA with auto CPAP, encouraged to use machine 6 nights a week starting now at 4 cm water, 2 cm EPR. Heated humidification set at highest.  2) morning headaches with congestion - Flonase has not helped. Try Allerga ( daily non drowsy) .  3) have a caraffe of destilled water on your bed-stand to refill the device -   I would like to thank [facility], Joylene Draft, MD and Chilton Greathouse, Md [address] [place],  [place] [address] for allowing me to meet with and to take care of this pleasant patient.   In short, LARANCE RATLEDGE is presenting with CPAP and improved compliance . I plan to follow up either personally or through our NP within 12 months.   CC: I will share my notes with PCP.  Electronically signed by: Melvyn Novas, MD 12/25/2021 1:58 PM  [facility]  Board certified by [employer] American Board of Sleep Medicine and Diplomate of the [employer]. Board certified In Neurology through the ABPN, Fellow of the [employer]. Medical Director of [facility].

## 2022-01-15 DIAGNOSIS — J0100 Acute maxillary sinusitis, unspecified: Secondary | ICD-10-CM | POA: Diagnosis not present

## 2022-01-15 DIAGNOSIS — E1169 Type 2 diabetes mellitus with other specified complication: Secondary | ICD-10-CM | POA: Diagnosis not present

## 2022-01-15 DIAGNOSIS — R051 Acute cough: Secondary | ICD-10-CM | POA: Diagnosis not present

## 2022-01-21 DIAGNOSIS — E1169 Type 2 diabetes mellitus with other specified complication: Secondary | ICD-10-CM | POA: Diagnosis not present

## 2022-01-21 DIAGNOSIS — I10 Essential (primary) hypertension: Secondary | ICD-10-CM | POA: Diagnosis not present

## 2022-01-21 DIAGNOSIS — Z23 Encounter for immunization: Secondary | ICD-10-CM | POA: Diagnosis not present

## 2022-01-24 DIAGNOSIS — G4733 Obstructive sleep apnea (adult) (pediatric): Secondary | ICD-10-CM | POA: Diagnosis not present

## 2022-05-06 DIAGNOSIS — E1169 Type 2 diabetes mellitus with other specified complication: Secondary | ICD-10-CM | POA: Diagnosis not present

## 2022-05-06 DIAGNOSIS — R079 Chest pain, unspecified: Secondary | ICD-10-CM | POA: Diagnosis not present

## 2022-05-06 DIAGNOSIS — I10 Essential (primary) hypertension: Secondary | ICD-10-CM | POA: Diagnosis not present

## 2022-05-30 DIAGNOSIS — E291 Testicular hypofunction: Secondary | ICD-10-CM | POA: Diagnosis not present

## 2022-05-30 DIAGNOSIS — Z79890 Hormone replacement therapy (postmenopausal): Secondary | ICD-10-CM | POA: Diagnosis not present

## 2022-05-30 DIAGNOSIS — E039 Hypothyroidism, unspecified: Secondary | ICD-10-CM | POA: Diagnosis not present

## 2022-06-11 DIAGNOSIS — Z72820 Sleep deprivation: Secondary | ICD-10-CM | POA: Diagnosis not present

## 2022-06-11 DIAGNOSIS — R7303 Prediabetes: Secondary | ICD-10-CM | POA: Diagnosis not present

## 2022-06-11 DIAGNOSIS — E291 Testicular hypofunction: Secondary | ICD-10-CM | POA: Diagnosis not present

## 2022-06-11 DIAGNOSIS — R6882 Decreased libido: Secondary | ICD-10-CM | POA: Diagnosis not present

## 2022-06-20 DIAGNOSIS — F411 Generalized anxiety disorder: Secondary | ICD-10-CM | POA: Diagnosis not present

## 2022-06-20 DIAGNOSIS — F331 Major depressive disorder, recurrent, moderate: Secondary | ICD-10-CM | POA: Diagnosis not present

## 2022-06-21 DIAGNOSIS — E291 Testicular hypofunction: Secondary | ICD-10-CM | POA: Diagnosis not present

## 2022-06-21 DIAGNOSIS — E1169 Type 2 diabetes mellitus with other specified complication: Secondary | ICD-10-CM | POA: Diagnosis not present

## 2022-06-21 DIAGNOSIS — I10 Essential (primary) hypertension: Secondary | ICD-10-CM | POA: Diagnosis not present

## 2022-06-27 DIAGNOSIS — E291 Testicular hypofunction: Secondary | ICD-10-CM | POA: Diagnosis not present

## 2022-07-04 DIAGNOSIS — E291 Testicular hypofunction: Secondary | ICD-10-CM | POA: Diagnosis not present

## 2022-07-11 DIAGNOSIS — E291 Testicular hypofunction: Secondary | ICD-10-CM | POA: Diagnosis not present

## 2022-07-18 DIAGNOSIS — Z79890 Hormone replacement therapy (postmenopausal): Secondary | ICD-10-CM | POA: Diagnosis not present

## 2022-07-18 DIAGNOSIS — E291 Testicular hypofunction: Secondary | ICD-10-CM | POA: Diagnosis not present

## 2022-07-18 DIAGNOSIS — E039 Hypothyroidism, unspecified: Secondary | ICD-10-CM | POA: Diagnosis not present

## 2022-07-25 DIAGNOSIS — E291 Testicular hypofunction: Secondary | ICD-10-CM | POA: Diagnosis not present

## 2022-08-02 DIAGNOSIS — H6123 Impacted cerumen, bilateral: Secondary | ICD-10-CM | POA: Diagnosis not present

## 2022-08-07 DIAGNOSIS — E291 Testicular hypofunction: Secondary | ICD-10-CM | POA: Diagnosis not present

## 2022-08-07 DIAGNOSIS — Z72820 Sleep deprivation: Secondary | ICD-10-CM | POA: Diagnosis not present

## 2022-08-07 DIAGNOSIS — N521 Erectile dysfunction due to diseases classified elsewhere: Secondary | ICD-10-CM | POA: Diagnosis not present

## 2022-08-07 DIAGNOSIS — R6882 Decreased libido: Secondary | ICD-10-CM | POA: Diagnosis not present

## 2022-08-16 DIAGNOSIS — E291 Testicular hypofunction: Secondary | ICD-10-CM | POA: Diagnosis not present

## 2022-08-23 DIAGNOSIS — E291 Testicular hypofunction: Secondary | ICD-10-CM | POA: Diagnosis not present

## 2022-08-29 DIAGNOSIS — E291 Testicular hypofunction: Secondary | ICD-10-CM | POA: Diagnosis not present

## 2022-09-05 DIAGNOSIS — E291 Testicular hypofunction: Secondary | ICD-10-CM | POA: Diagnosis not present

## 2022-09-11 DIAGNOSIS — F411 Generalized anxiety disorder: Secondary | ICD-10-CM | POA: Diagnosis not present

## 2022-09-11 DIAGNOSIS — F331 Major depressive disorder, recurrent, moderate: Secondary | ICD-10-CM | POA: Diagnosis not present

## 2022-09-12 DIAGNOSIS — E291 Testicular hypofunction: Secondary | ICD-10-CM | POA: Diagnosis not present

## 2022-09-19 DIAGNOSIS — E291 Testicular hypofunction: Secondary | ICD-10-CM | POA: Diagnosis not present

## 2022-09-26 DIAGNOSIS — E291 Testicular hypofunction: Secondary | ICD-10-CM | POA: Diagnosis not present

## 2022-10-08 DIAGNOSIS — E785 Hyperlipidemia, unspecified: Secondary | ICD-10-CM | POA: Diagnosis not present

## 2022-10-08 DIAGNOSIS — Z125 Encounter for screening for malignant neoplasm of prostate: Secondary | ICD-10-CM | POA: Diagnosis not present

## 2022-10-08 DIAGNOSIS — E1169 Type 2 diabetes mellitus with other specified complication: Secondary | ICD-10-CM | POA: Diagnosis not present

## 2022-10-10 DIAGNOSIS — E291 Testicular hypofunction: Secondary | ICD-10-CM | POA: Diagnosis not present

## 2022-10-14 DIAGNOSIS — E1169 Type 2 diabetes mellitus with other specified complication: Secondary | ICD-10-CM | POA: Diagnosis not present

## 2022-10-14 DIAGNOSIS — Z0000 Encounter for general adult medical examination without abnormal findings: Secondary | ICD-10-CM | POA: Diagnosis not present

## 2022-10-14 DIAGNOSIS — Z1339 Encounter for screening examination for other mental health and behavioral disorders: Secondary | ICD-10-CM | POA: Diagnosis not present

## 2022-10-14 DIAGNOSIS — Z1331 Encounter for screening for depression: Secondary | ICD-10-CM | POA: Diagnosis not present

## 2022-10-14 DIAGNOSIS — I10 Essential (primary) hypertension: Secondary | ICD-10-CM | POA: Diagnosis not present

## 2022-10-31 DIAGNOSIS — E291 Testicular hypofunction: Secondary | ICD-10-CM | POA: Diagnosis not present

## 2022-11-13 DIAGNOSIS — Z79890 Hormone replacement therapy (postmenopausal): Secondary | ICD-10-CM | POA: Diagnosis not present

## 2022-11-13 DIAGNOSIS — E291 Testicular hypofunction: Secondary | ICD-10-CM | POA: Diagnosis not present

## 2022-11-27 DIAGNOSIS — N521 Erectile dysfunction due to diseases classified elsewhere: Secondary | ICD-10-CM | POA: Diagnosis not present

## 2022-11-27 DIAGNOSIS — R6882 Decreased libido: Secondary | ICD-10-CM | POA: Diagnosis not present

## 2022-11-27 DIAGNOSIS — E291 Testicular hypofunction: Secondary | ICD-10-CM | POA: Diagnosis not present

## 2022-11-27 DIAGNOSIS — I10 Essential (primary) hypertension: Secondary | ICD-10-CM | POA: Diagnosis not present

## 2022-12-26 ENCOUNTER — Ambulatory Visit: Payer: Self-pay | Admitting: Adult Health

## 2023-01-08 DIAGNOSIS — Z029 Encounter for administrative examinations, unspecified: Secondary | ICD-10-CM | POA: Diagnosis not present

## 2023-01-08 DIAGNOSIS — Z7689 Persons encountering health services in other specified circumstances: Secondary | ICD-10-CM | POA: Diagnosis not present

## 2023-01-08 DIAGNOSIS — I10 Essential (primary) hypertension: Secondary | ICD-10-CM | POA: Diagnosis not present

## 2023-01-08 DIAGNOSIS — E1169 Type 2 diabetes mellitus with other specified complication: Secondary | ICD-10-CM | POA: Diagnosis not present

## 2023-01-08 DIAGNOSIS — G4733 Obstructive sleep apnea (adult) (pediatric): Secondary | ICD-10-CM | POA: Diagnosis not present

## 2023-01-08 DIAGNOSIS — F331 Major depressive disorder, recurrent, moderate: Secondary | ICD-10-CM | POA: Diagnosis not present

## 2023-01-08 DIAGNOSIS — F411 Generalized anxiety disorder: Secondary | ICD-10-CM | POA: Diagnosis not present

## 2023-01-08 DIAGNOSIS — E785 Hyperlipidemia, unspecified: Secondary | ICD-10-CM | POA: Diagnosis not present

## 2023-01-08 DIAGNOSIS — J309 Allergic rhinitis, unspecified: Secondary | ICD-10-CM | POA: Diagnosis not present

## 2023-01-21 ENCOUNTER — Emergency Department (HOSPITAL_BASED_OUTPATIENT_CLINIC_OR_DEPARTMENT_OTHER)
Admission: EM | Admit: 2023-01-21 | Discharge: 2023-01-21 | Disposition: A | Discharge status: Home / Self Care | Payer: 59 | Attending: Emergency Medicine | Admitting: Emergency Medicine

## 2023-01-21 ENCOUNTER — Other Ambulatory Visit: Payer: Self-pay

## 2023-01-21 ENCOUNTER — Emergency Department (HOSPITAL_BASED_OUTPATIENT_CLINIC_OR_DEPARTMENT_OTHER): Payer: 59

## 2023-01-21 ENCOUNTER — Encounter (HOSPITAL_BASED_OUTPATIENT_CLINIC_OR_DEPARTMENT_OTHER): Payer: Self-pay | Admitting: Emergency Medicine

## 2023-01-21 DIAGNOSIS — Z79899 Other long term (current) drug therapy: Secondary | ICD-10-CM | POA: Insufficient documentation

## 2023-01-21 DIAGNOSIS — E119 Type 2 diabetes mellitus without complications: Secondary | ICD-10-CM | POA: Insufficient documentation

## 2023-01-21 DIAGNOSIS — Z7984 Long term (current) use of oral hypoglycemic drugs: Secondary | ICD-10-CM | POA: Insufficient documentation

## 2023-01-21 DIAGNOSIS — K8000 Calculus of gallbladder with acute cholecystitis without obstruction: Secondary | ICD-10-CM | POA: Insufficient documentation

## 2023-01-21 DIAGNOSIS — R1032 Left lower quadrant pain: Secondary | ICD-10-CM | POA: Diagnosis not present

## 2023-01-21 DIAGNOSIS — R1084 Generalized abdominal pain: Secondary | ICD-10-CM | POA: Diagnosis not present

## 2023-01-21 DIAGNOSIS — E876 Hypokalemia: Secondary | ICD-10-CM | POA: Insufficient documentation

## 2023-01-21 DIAGNOSIS — I10 Essential (primary) hypertension: Secondary | ICD-10-CM | POA: Diagnosis not present

## 2023-01-21 DIAGNOSIS — R1013 Epigastric pain: Secondary | ICD-10-CM | POA: Diagnosis not present

## 2023-01-21 DIAGNOSIS — R112 Nausea with vomiting, unspecified: Secondary | ICD-10-CM

## 2023-01-21 DIAGNOSIS — K8020 Calculus of gallbladder without cholecystitis without obstruction: Secondary | ICD-10-CM | POA: Diagnosis not present

## 2023-01-21 DIAGNOSIS — K8010 Calculus of gallbladder with chronic cholecystitis without obstruction: Secondary | ICD-10-CM

## 2023-01-21 LAB — CBC
HCT: 48.7 % (ref 39.0–52.0)
Hemoglobin: 16.4 g/dL (ref 13.0–17.0)
MCH: 27.2 pg (ref 26.0–34.0)
MCHC: 33.7 g/dL (ref 30.0–36.0)
MCV: 80.8 fL (ref 80.0–100.0)
Platelets: 199 10*3/uL (ref 150–400)
RBC: 6.03 MIL/uL — ABNORMAL HIGH (ref 4.22–5.81)
RDW: 14.3 % (ref 11.5–15.5)
WBC: 7.2 10*3/uL (ref 4.0–10.5)
nRBC: 0 % (ref 0.0–0.2)

## 2023-01-21 LAB — COMPREHENSIVE METABOLIC PANEL
ALT: 11 U/L (ref 0–44)
AST: 12 U/L — ABNORMAL LOW (ref 15–41)
Albumin: 4.7 g/dL (ref 3.5–5.0)
Alkaline Phosphatase: 95 U/L (ref 38–126)
Anion gap: 9 (ref 5–15)
BUN: 15 mg/dL (ref 6–20)
CO2: 34 mmol/L — ABNORMAL HIGH (ref 22–32)
Calcium: 10.6 mg/dL — ABNORMAL HIGH (ref 8.9–10.3)
Chloride: 97 mmol/L — ABNORMAL LOW (ref 98–111)
Creatinine, Ser: 1.04 mg/dL (ref 0.61–1.24)
GFR, Estimated: >60 mL/min (ref >60)
Glucose, Bld: 116 mg/dL — ABNORMAL HIGH (ref 70–99)
Potassium: 3.1 mmol/L — ABNORMAL LOW (ref 3.5–5.1)
Sodium: 140 mmol/L (ref 135–145)
Total Bilirubin: 0.6 mg/dL (ref <1.2)
Total Protein: 7.6 g/dL (ref 6.5–8.1)

## 2023-01-21 LAB — LIPASE, BLOOD: Lipase: 39 U/L (ref 11–51)

## 2023-01-21 MED ORDER — IOHEXOL 300 MG/ML  SOLN
100.0000 mL | Freq: Once | INTRAMUSCULAR | Status: AC | PRN
  Administered 2023-01-21: 100 mL via INTRAVENOUS

## 2023-01-21 MED ORDER — ONDANSETRON 4 MG PO TBDP: 4.0000 mg | ORAL_TABLET | Freq: Three times a day (TID) | ORAL | 0 refills | Status: AC | PRN

## 2023-01-21 MED ORDER — ONDANSETRON HCL 4 MG/2ML IJ SOLN
4.0000 mg | Freq: Once | INTRAMUSCULAR | Status: AC
  Administered 2023-01-21: 4 mg via INTRAVENOUS
  Filled 2023-01-21: qty 2

## 2023-01-21 MED ORDER — SODIUM CHLORIDE 0.9 % IV BOLUS
1000.0000 mL | Freq: Once | INTRAVENOUS | Status: AC
  Administered 2023-01-21: 1000 mL via INTRAVENOUS

## 2023-01-21 NOTE — ED Provider Notes (Signed)
[place] EMERGENCY DEPARTMENT AT [facility] Provider Note   CSN: [id number] Arrival date & time: 01/21/23  1605     History  Chief Complaint  Patient presents with   Abdominal Pain    Clinton Jackson is a 47 y.o. male.   Abdominal Pain Patient presents abdominal pain nausea and vomiting  Started Sunday with today being Tuesday.  Did have recent mumps immunization but also had recent increase of his Ozempic dose.  Has had vomiting.  Some diffuse abdominal pain but particular on the left side of his abdomen.  No fevers.  No diarrhea.  Has not had vomiting with his Ozempic dose previously.  Sent in to rule out pancreatitis reportedly by urgent care.    Past Medical History:  Diagnosis Date   Depression    Diabetes mellitus without complication (HCC)    Hyperlipidemia    Hypertension    Multiple gastric ulcers    Primary snoring 05/07/2013   Severe obesity (BMI >= 40) (HCC) 05/07/2013    Home Medications Prior to Admission medications   Medication Sig Start Date End Date Taking? Authorizing Provider  ondansetron (ZOFRAN-ODT) 4 MG disintegrating tablet Take 1 tablet (4 mg total) by mouth every 8 (eight) hours as needed. 01/21/23  Yes Benjiman Core, MD  albuterol (VENTOLIN HFA) 108 (90 Base) MCG/ACT inhaler  05/16/21   [provider]  amLODipine (NORVASC) 10 MG tablet 1 tablet daily. 04/06/13   [provider]  anastrozole (ARIMIDEX) 1 MG tablet Take 1 tablet by mouth once a week. 12/24/18   [provider]  buPROPion (WELLBUTRIN SR) 200 MG 12 hr tablet Take 200 mg by mouth 2 (two) times daily.  04/06/13   [provider]  FARXIGA 10 MG TABS tablet TAKE 1 TABLET BY MOUTH ONCE DAILY IN THE MORNING 05/27/18   [provider]  fexofenadine (ALLEGRA ALLERGY) 60 MG tablet Take 1 tablet (60 mg total) by mouth daily. 12/25/21   Dohmeier, Porfirio Mylar, MD  FLUoxetine (PROZAC) 40 MG capsule Take 40 mg by mouth 2 (two) times daily.      [provider]  fluticasone (FLONASE) 50 MCG/ACT nasal spray 2 sprays daily. 04/06/13   [provider]  hydrochlorothiazide (HYDRODIURIL) 25 MG tablet 1 tablet daily. 04/06/13   [provider]  metFORMIN (GLUCOPHAGE-XR) 500 MG 24 hr tablet Take 1,000 mg by mouth 2 (two) times daily. 05/27/18   [provider]  pantoprazole (PROTONIX) 40 MG tablet  05/18/21   [provider]  rosuvastatin (CRESTOR) 5 MG tablet Take 5 mg by mouth daily. 06/05/21   [provider]  sildenafil (REVATIO) 20 MG tablet TAKE 1 TO 4 TABLETS BY MOUTH AS NEEDED ONCE DAILY PRIOR TO SEXUAL INTERCOURSE 04/05/20   [provider]      Allergies    Penicillins    Review of Systems   Review of Systems  Gastrointestinal:  Positive for abdominal pain.    Physical Exam Updated Vital Signs BP (!) 129/90   Pulse 72   Temp 98 F (36.7 C) (Oral)   Resp 16   SpO2 100%  Physical Exam Vitals and nursing note reviewed.  Cardiovascular:     Rate and Rhythm: Normal rate and regular rhythm.  Abdominal:     Tenderness: There is abdominal tenderness.     Hernia: No hernia is present.     Comments: Mild diffuse abdominal tenderness but worse on the left lower abdomen.  Neurological:     Mental  Status: He is alert.     ED Results / Procedures / Treatments   Labs (all labs ordered are listed, but only abnormal results are displayed) Labs Reviewed  COMPREHENSIVE METABOLIC PANEL - Abnormal; Notable for the following components:      Result Value   Potassium 3.1 (*)    Chloride 97 (*)    CO2 34 (*)    Glucose, Bld 116 (*)    Calcium 10.6 (*)    AST 12 (*)    All other components within normal limits  CBC - Abnormal; Notable for the following components:   RBC 6.03 (*)    All other components within normal limits  LIPASE, BLOOD    EKG None  Radiology CT ABDOMEN PELVIS W CONTRAST Result Date: 01/21/2023 CLINICAL DATA:  Left lower quadrant pain vomiting  EXAM: CT ABDOMEN AND PELVIS WITH CONTRAST TECHNIQUE: Multidetector CT imaging of the abdomen and pelvis was performed using the standard protocol following bolus administration of intravenous contrast. RADIATION DOSE REDUCTION: This exam was performed according to the departmental dose-optimization program which includes automated exposure control, adjustment of the mA and/or kV according to patient size and/or use of iterative reconstruction technique. CONTRAST:  OMNIPAQUE IOHEXOL 300 MG/ML  SOLN COMPARISON:  CT 11/19/2017 FINDINGS: Lower chest: Lung bases are clear. Hepatobiliary: No focal liver abnormality is seen. Gallstones. No biliary dilatation. Pancreas: Unremarkable. No pancreatic ductal dilatation or surrounding inflammatory changes. Spleen: Normal in size without focal abnormality. Adrenals/Urinary Tract: Adrenal glands are unremarkable. Kidneys are normal, without renal calculi, focal lesion, or hydronephrosis. Bladder is unremarkable. Stomach/Bowel: Stomach is within normal limits. Appendix appears normal. No evidence of bowel wall thickening, distention, or inflammatory changes. Vascular/Lymphatic: No significant vascular findings are present. No enlarged abdominal or pelvic lymph nodes. Reproductive: Prostate is unremarkable. Other: No abdominal wall hernia or abnormality. No abdominopelvic ascites. Musculoskeletal: No acute or significant osseous findings. IMPRESSION: 1. No CT evidence for acute intra-abdominal or pelvic abnormality. 2. Gallstones. Electronically Signed   By: Jasmine Pang M.D.   On: 01/21/2023 21:03    Procedures Procedures    Medications Ordered in ED Medications  sodium chloride 0.9 % bolus 1,000 mL (1,000 mLs Intravenous New Bag/Given 01/21/23 1931)  ondansetron (ZOFRAN) injection 4 mg (4 mg Intravenous Given 01/21/23 1930)  iohexol (OMNIPAQUE) 300 MG/ML solution 100 mL (100 mLs Intravenous Contrast Given 01/21/23 1934)    ED Course/ Medical Decision Making/  A&P                                 Medical Decision Making Amount and/or Complexity of Data Reviewed Labs: ordered. Radiology: ordered.  Risk Prescription drug management.   Patient abdominal pain nausea and vomiting.  Differential diagnoses long but includes causes such as reaction to mom shops, reaction to his Ozempic shot.  Gastroenteritis.  Given biliary colic.  Blood work overall reassuring.  Mild hypokalemia.  LFTs normal.  CT scan done due to left lower quadrant pain and shows no diverticulitis, but does have some gallstones.  I think this is unlikely the cause of the vomiting but will give surgery follow-up.  Has tolerated orals.  Feeling better after fluids.  Will give Zofran and discharged home.        Final Clinical Impression(s) / ED Diagnoses Final diagnoses:  Nausea and vomiting, unspecified vomiting type  Calculus of gallbladder with cholecystitis without biliary obstruction, unspecified cholecystitis acuity    Rx /  DC Orders ED Discharge Orders          Ordered    ondansetron (ZOFRAN-ODT) 4 MG disintegrating tablet  Every 8 hours PRN        01/21/23 2223              Benjiman Core, MD 01/21/23 2228

## 2023-01-21 NOTE — ED Triage Notes (Signed)
Epigastric pain tenderness on palitation, vomiting  Started Sunday.  On ozempic and recently got mumps vaccine, possible reaction.  Seen at [facility] and sent for concern for pancreatitis

## 2023-02-03 DIAGNOSIS — E291 Testicular hypofunction: Secondary | ICD-10-CM | POA: Diagnosis not present

## 2023-02-24 DIAGNOSIS — E291 Testicular hypofunction: Secondary | ICD-10-CM | POA: Diagnosis not present

## 2023-11-18 IMAGING — CR DG ABDOMEN 1V
2 series · 2 of 2 positions shown · non-contrast
Comparison: CT abdomen 11/19/2017

CLINICAL DATA: Loose stools.  Right upper quadrant pain.

EXAM:
ABDOMEN - 1 VIEW

[t abdomen supine (1 of 2)]
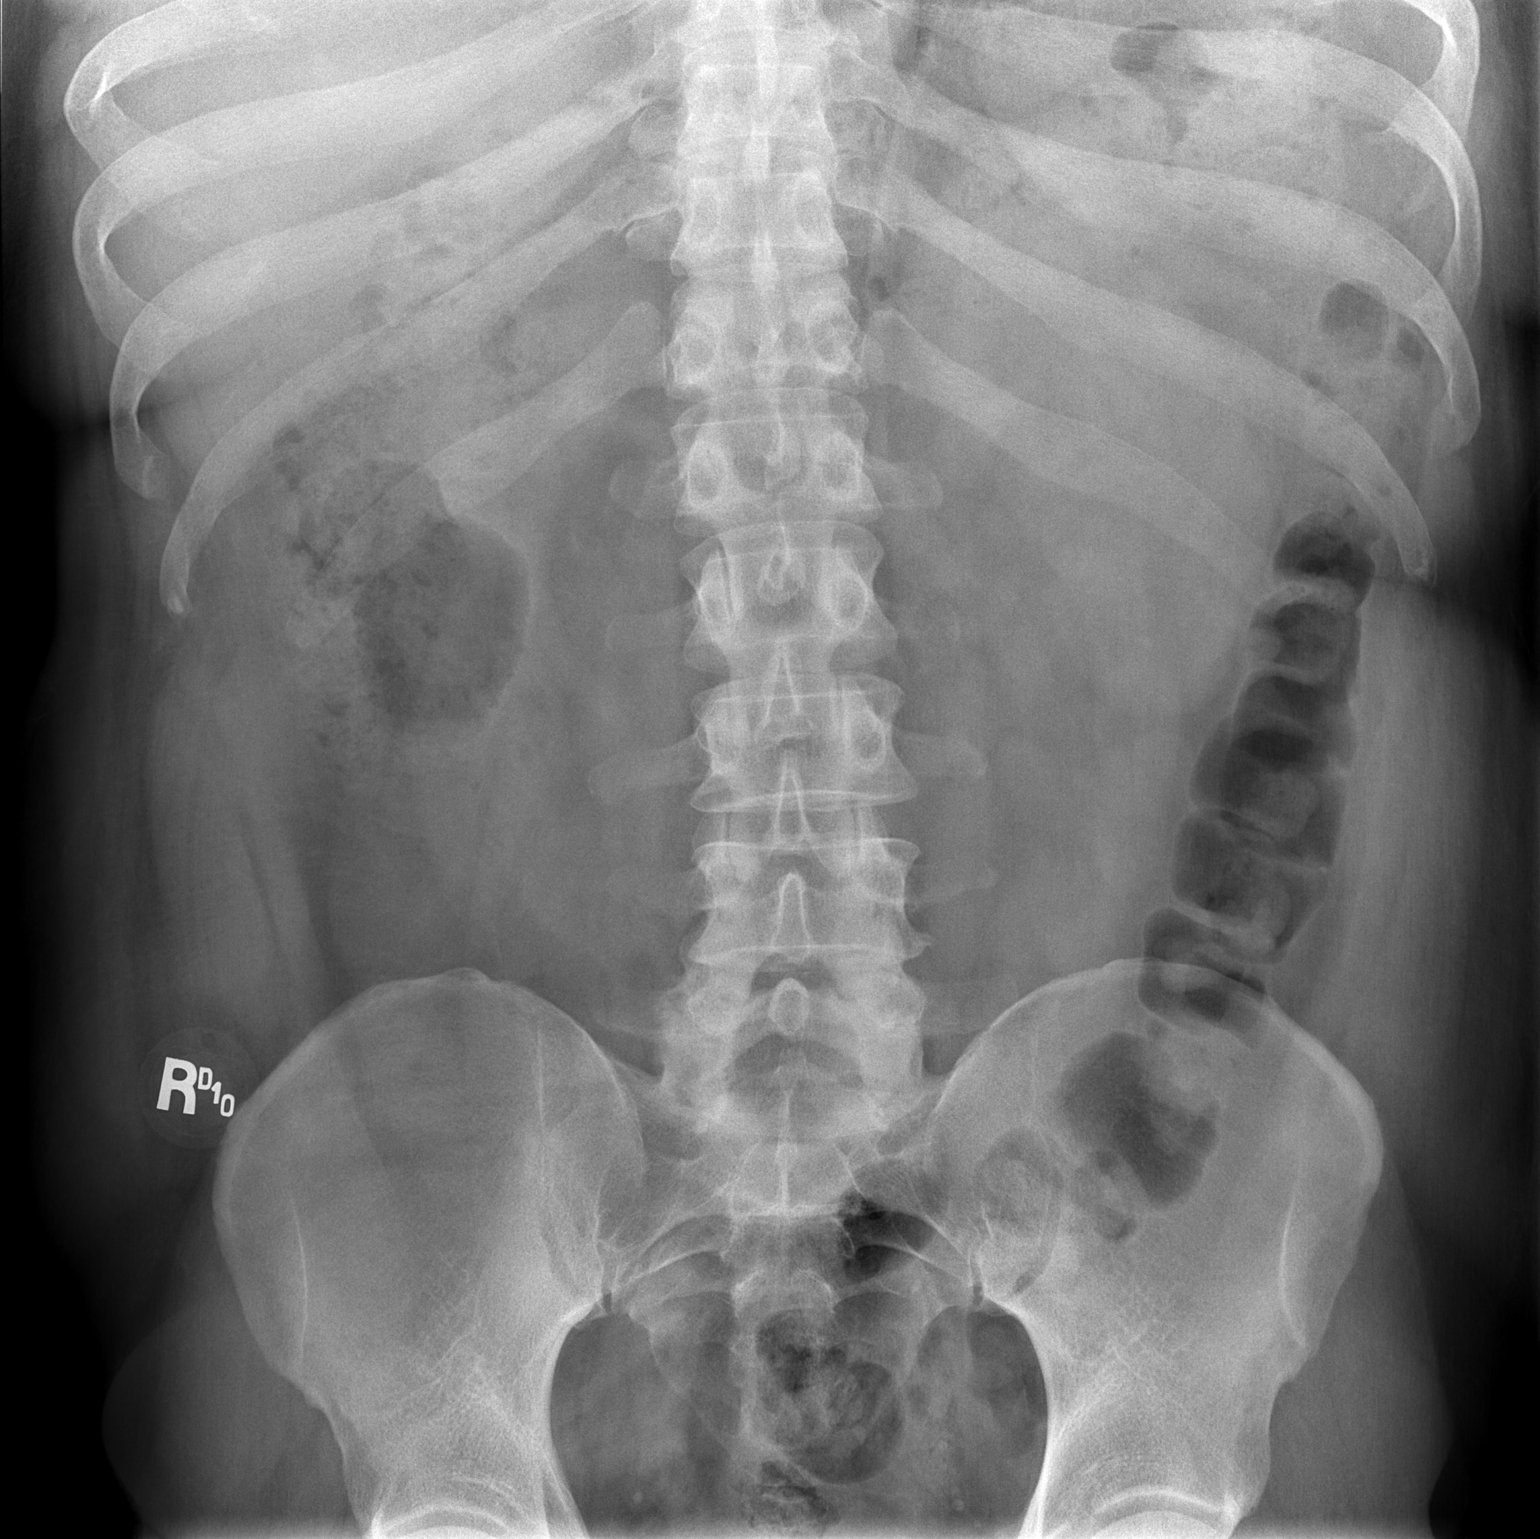

[t abdomen supine (2 of 2)]
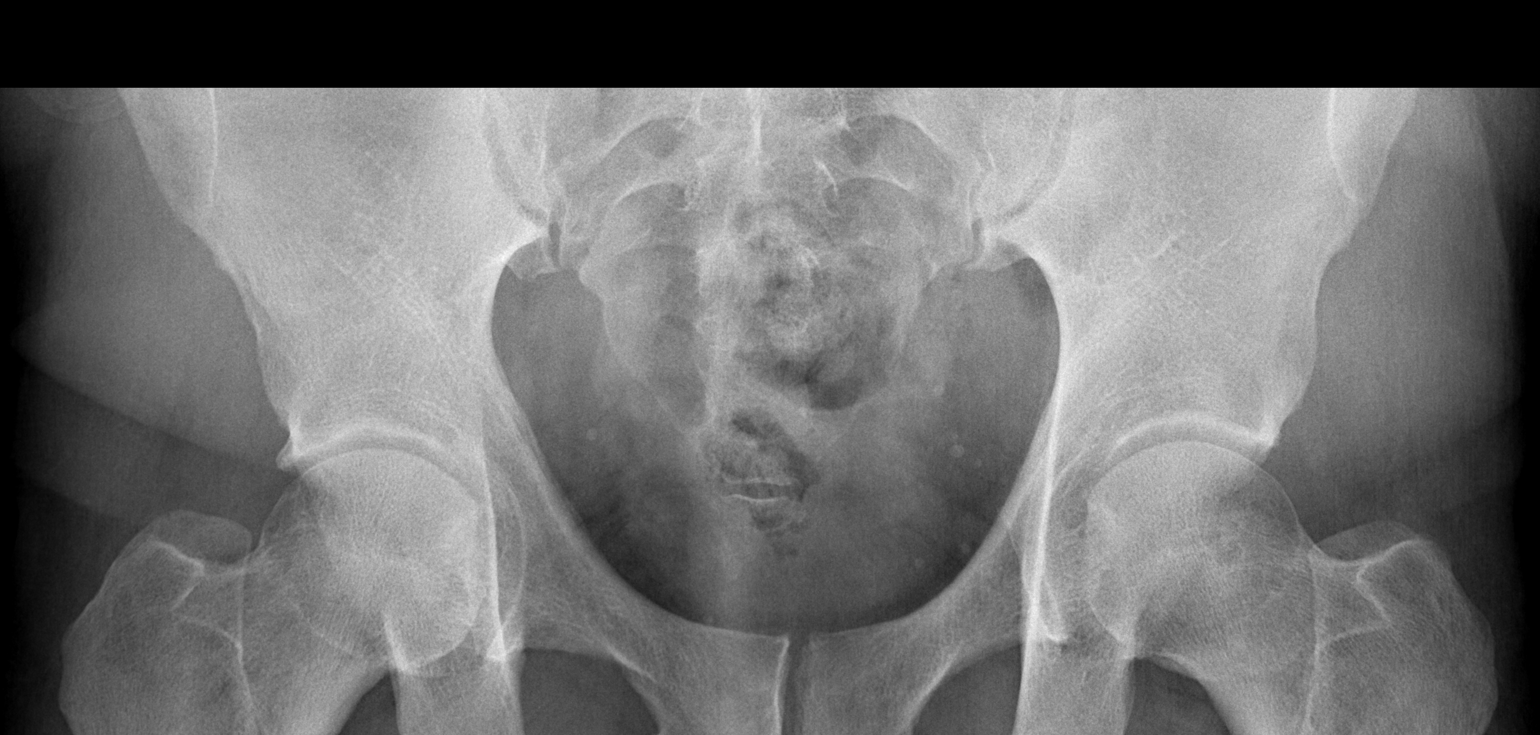

[2 of 2 positions shown; findings below may reference images not displayed]

FINDINGS: Gas and a small amount of stool are present in the colon. There is a
paucity of small bowel gas. No dilated loops of bowel are seen to
suggest obstruction. No acute osseous abnormality is seen.
IMPRESSION: Negative.
